# Patient Record
Sex: Male | Born: 1965
Health system: Southern US, Community
[De-identification: ages and names within clinical notes are randomized; demographics above are authoritative.]

## PROBLEM LIST (undated history)

## (undated) DIAGNOSIS — R768 Other specified abnormal immunological findings in serum: Secondary | ICD-10-CM

## (undated) DIAGNOSIS — K219 Gastro-esophageal reflux disease without esophagitis: Secondary | ICD-10-CM

## (undated) DIAGNOSIS — Z860101 Personal history of adenomatous and serrated colon polyps: Secondary | ICD-10-CM

## (undated) HISTORY — PX: POLYPECTOMY: SHX149

## (undated) HISTORY — DX: Gastro-esophageal reflux disease without esophagitis: K21.9

## (undated) HISTORY — DX: Other specified abnormal immunological findings in serum: R76.8

## (undated) HISTORY — PX: COLONOSCOPY: SHX174

## (undated) HISTORY — DX: Personal history of adenomatous and serrated colon polyps: Z86.0101

---

## 2000-02-15 HISTORY — PX: KNEE ARTHROSCOPY W/ LASER: SHX1872

## 2001-10-29 ENCOUNTER — Encounter: Admission: RE | Admit: 2001-10-29 | Discharge: 2001-10-29 | Payer: Self-pay | Admitting: Internal Medicine

## 2004-06-28 ENCOUNTER — Encounter: Admission: RE | Admit: 2004-06-28 | Discharge: 2004-06-28 | Payer: Self-pay | Admitting: Specialist

## 2007-08-21 ENCOUNTER — Ambulatory Visit: Payer: Self-pay | Admitting: Internal Medicine

## 2007-08-21 DIAGNOSIS — M25469 Effusion, unspecified knee: Secondary | ICD-10-CM | POA: Insufficient documentation

## 2007-08-21 DIAGNOSIS — M25569 Pain in unspecified knee: Secondary | ICD-10-CM | POA: Insufficient documentation

## 2007-08-22 ENCOUNTER — Encounter (INDEPENDENT_AMBULATORY_CARE_PROVIDER_SITE_OTHER): Payer: Self-pay | Admitting: *Deleted

## 2007-08-29 ENCOUNTER — Encounter: Payer: Self-pay | Admitting: Internal Medicine

## 2010-06-29 NOTE — Assessment & Plan Note (Signed)
[facility]                                 ON-CALL NOTE   Bryan, Ward                    MRN:          [id number]  DATE:08/20/2007                            DOB:          06/09/1964    DATE OF INTERACTION:  August 20, 2007 at 5:21 p.m.   PHONE NUMBER:  [phone].   OBJECTIVE:  The patient wants to talk with Dr. Posey Rea.  I am not sure  he understands that Dr. Posey Rea is not on-call.  He does not want to  discuss whatever it is with me and would like to talk with or see Dr.  Posey Rea.  I told him to call tomorrow morning at 8:30 and try and get  an appointment with Dr. Posey Rea or speak with him on the phone.   Primary care [name] is Dr. Posey Rea, home office at [facility].     Arta Silence, MD  Electronically Signed    RNS/MedQ  DD: 08/20/2007  DT: 08/21/2007  Job #: [id number]

## 2010-06-29 NOTE — Assessment & Plan Note (Signed)
[facility]                                 ON-CALL NOTE   ISMEAL, HEIDER                    MRN:          [id number]  DATE:08/20/2007                            DOB:          06/09/1964    DATE OF INTERACTION:  August 20, 2007 at 5:21 p.m.   PHONE NUMBER:  [phone].   OBJECTIVE:  The patient wants to talk with Dr. Posey Rea.  I am not sure  he understands that Dr. Posey Rea is not on-call.  He does not want to  discuss whatever it is with me and would like to talk with or see Dr.  Posey Rea.  I told him to call tomorrow morning at 8:30 and try and get  an appointment with Dr. Posey Rea or speak with him on the phone.   Primary care Omaria Plunk is Dr. Posey Rea, home office at [facility].     Arta Silence, MD     RNS/MedQ  DD: 08/20/2007  DT: 08/21/2007  Job #: [id number]

## 2013-01-11 ENCOUNTER — Ambulatory Visit (INDEPENDENT_AMBULATORY_CARE_PROVIDER_SITE_OTHER)
Admission: RE | Admit: 2013-01-11 | Discharge: 2013-01-11 | Disposition: A | Discharge status: Home / Self Care | Payer: BC Managed Care – PPO | Source: Ambulatory Visit | Attending: Internal Medicine | Admitting: Internal Medicine

## 2013-01-11 ENCOUNTER — Encounter: Payer: Self-pay | Admitting: Internal Medicine

## 2013-01-11 ENCOUNTER — Ambulatory Visit (INDEPENDENT_AMBULATORY_CARE_PROVIDER_SITE_OTHER): Payer: BC Managed Care – PPO | Admitting: Internal Medicine

## 2013-01-11 VITALS — BP 104/80 | HR 80 | Temp 98.9°F | Resp 16 | Ht 73.0 in | Wt 224.0 lb

## 2013-01-11 DIAGNOSIS — M79645 Pain in left finger(s): Secondary | ICD-10-CM | POA: Insufficient documentation

## 2013-01-11 DIAGNOSIS — Z0000 Encounter for general adult medical examination without abnormal findings: Secondary | ICD-10-CM

## 2013-01-11 DIAGNOSIS — Z23 Encounter for immunization: Secondary | ICD-10-CM

## 2013-01-11 MED ORDER — MELOXICAM 15 MG PO TABS: 15.0000 mg | ORAL_TABLET | Freq: Every day | ORAL | Status: DC

## 2013-01-11 MED ORDER — VITAMIN D 1000 UNITS PO TABS: 1000.0000 [IU] | ORAL_TABLET | Freq: Every day | ORAL | Status: DC

## 2013-01-11 NOTE — Progress Notes (Signed)
Pre visit review using our clinic review tool, if applicable. No additional management support is needed unless otherwise documented below in the visit note. 

## 2013-01-11 NOTE — Assessment & Plan Note (Signed)
11/14 Trigger thumb Meloxicam X ray

## 2013-01-11 NOTE — Progress Notes (Signed)
   Subjective:    Patient ID: Bryan Ward, male    DOB: 04-13-1965, 47 y.o.   MRN: [id number]  HPI  The patient is here for a wellness exam. The patient has been doing well overall without major physical or psychological issues going on lately.  C/o L thumb pain x weeks, worse in am, clicking  Night shift work  Review of Systems  Constitutional: Negative for appetite change, fatigue and unexpected weight change.  HENT: Negative for congestion, dental problem, ear pain, nosebleeds, rhinorrhea, sneezing, sore throat and trouble swallowing.   Eyes: Negative for itching and visual disturbance.  Respiratory: Positive for cough.   Cardiovascular: Negative for chest pain, palpitations and leg swelling.  Gastrointestinal: Negative for nausea, vomiting, abdominal pain, diarrhea, blood in stool and abdominal distention.  Genitourinary: Negative for frequency and hematuria.  Musculoskeletal: Negative for back pain, gait problem, joint swelling, neck pain and neck stiffness.  Skin: Negative for rash and wound.  Neurological: Negative for dizziness, tremors, speech difficulty and weakness.  Psychiatric/Behavioral: Negative for suicidal ideas, sleep disturbance, dysphoric mood and agitation. The patient is not nervous/anxious.        Objective:   Physical Exam  Constitutional: He is oriented to person, place, and time. He appears well-developed and well-nourished. No distress.  HENT:  Head: Normocephalic and atraumatic.  Right Ear: External ear normal.  Left Ear: External ear normal.  Nose: Nose normal.  Mouth/Throat: Oropharynx is clear and moist. No oropharyngeal exudate.  Eyes: Conjunctivae and EOM are normal. Pupils are equal, round, and reactive to light. Right eye exhibits no discharge. Left eye exhibits no discharge. No scleral icterus.  Neck: Normal range of motion. Neck supple. No JVD present. No tracheal deviation present. No thyromegaly present.  Cardiovascular: Normal rate,  regular rhythm, normal heart sounds and intact distal pulses.  Exam reveals no gallop and no friction rub.   No murmur heard. Pulmonary/Chest: Effort normal and breath sounds normal. No stridor. No respiratory distress. He has no wheezes. He has no rales. He exhibits no tenderness.  Abdominal: Soft. Bowel sounds are normal. He exhibits no distension and no mass. There is no tenderness. There is no rebound and no guarding.  Genitourinary: Guaiac negative stool. No penile tenderness.  Musculoskeletal: Normal range of motion. He exhibits no edema and no tenderness.  B thumbs are WNL at present  Lymphadenopathy:    He has no cervical adenopathy.  Neurological: He is alert and oriented to person, place, and time. He has normal reflexes. No cranial nerve deficit. He exhibits normal muscle tone. Coordination normal.  Skin: Skin is warm and dry. No rash noted. He is not diaphoretic. No erythema. No pallor.  Psychiatric: He has a normal mood and affect. His behavior is normal. Judgment and thought content normal.    EKG nl      Assessment & Plan:

## 2013-01-11 NOTE — Assessment & Plan Note (Signed)
We discussed age appropriate health related issues, including available/recomended screening tests and vaccinations. We discussed a need for adhering to healthy diet and exercise. Labs/EKG were reviewed/ordered. All questions were answered. Labs EKG CXR due to cough

## 2013-01-16 ENCOUNTER — Other Ambulatory Visit (INDEPENDENT_AMBULATORY_CARE_PROVIDER_SITE_OTHER): Payer: BC Managed Care – PPO

## 2013-01-16 DIAGNOSIS — Z0000 Encounter for general adult medical examination without abnormal findings: Secondary | ICD-10-CM

## 2013-01-16 LAB — CBC WITH DIFFERENTIAL/PLATELET
Eosinophils Absolute: 0.3 10*3/uL (ref 0.0–0.7)
Eosinophils Relative: 3.7 % (ref 0.0–5.0)
HCT: 44.3 % (ref 39.0–52.0)
Hemoglobin: 15 g/dL (ref 13.0–17.0)
Lymphs Abs: 2.7 10*3/uL (ref 0.7–4.0)
Monocytes Absolute: 0.7 10*3/uL (ref 0.1–1.0)
Neutro Abs: 4.7 10*3/uL (ref 1.4–7.7)
Neutrophils Relative %: 55.9 % (ref 43.0–77.0)
Platelets: 239 10*3/uL (ref 150.0–400.0)
RBC: 5.03 Mil/uL (ref 4.22–5.81)
RDW: 13.5 % (ref 11.5–14.6)
WBC: 8.5 10*3/uL (ref 4.5–10.5)

## 2013-01-16 LAB — BASIC METABOLIC PANEL
Chloride: 105 mEq/L (ref 96–112)
Creatinine, Ser: 1 mg/dL (ref 0.4–1.5)

## 2013-01-16 LAB — HEPATIC FUNCTION PANEL
Alkaline Phosphatase: 59 U/L (ref 39–117)
Total Bilirubin: 0.8 mg/dL (ref 0.3–1.2)

## 2013-01-16 LAB — LIPID PANEL
HDL: 39.9 mg/dL (ref >39.00)
LDL Cholesterol: 129 mg/dL — ABNORMAL HIGH (ref 0–99)
Total CHOL/HDL Ratio: 5

## 2013-01-16 LAB — URINALYSIS
Hgb urine dipstick: NEGATIVE
Ketones, ur: NEGATIVE
Nitrite: NEGATIVE
Total Protein, Urine: NEGATIVE
Urine Glucose: NEGATIVE
pH: 6 (ref 5.0–8.0)

## 2013-01-16 LAB — PSA: PSA: 0.67 ng/mL (ref 0.10–4.00)

## 2013-01-24 MED ORDER — MELOXICAM 15 MG PO TABS: 15.0000 mg | ORAL_TABLET | Freq: Every day | ORAL | Status: DC

## 2013-01-24 MED ORDER — VITAMIN D 1000 UNITS PO TABS: 1000.0000 [IU] | ORAL_TABLET | Freq: Every day | ORAL | Status: AC

## 2014-01-14 ENCOUNTER — Encounter: Payer: Self-pay | Admitting: Internal Medicine

## 2014-01-14 ENCOUNTER — Ambulatory Visit (INDEPENDENT_AMBULATORY_CARE_PROVIDER_SITE_OTHER)
Admission: RE | Admit: 2014-01-14 | Discharge: 2014-01-14 | Disposition: A | Discharge status: Home / Self Care | Payer: BC Managed Care – PPO | Source: Ambulatory Visit | Attending: Internal Medicine | Admitting: Internal Medicine

## 2014-01-14 ENCOUNTER — Ambulatory Visit (INDEPENDENT_AMBULATORY_CARE_PROVIDER_SITE_OTHER): Payer: BC Managed Care – PPO | Admitting: Internal Medicine

## 2014-01-14 ENCOUNTER — Other Ambulatory Visit (INDEPENDENT_AMBULATORY_CARE_PROVIDER_SITE_OTHER): Payer: BC Managed Care – PPO

## 2014-01-14 VITALS — BP 140/90 | HR 84 | Temp 97.6°F | Ht 72.0 in | Wt 221.0 lb

## 2014-01-14 DIAGNOSIS — Z23 Encounter for immunization: Secondary | ICD-10-CM

## 2014-01-14 DIAGNOSIS — R05 Cough: Secondary | ICD-10-CM

## 2014-01-14 DIAGNOSIS — B9681 Helicobacter pylori [H. pylori] as the cause of diseases classified elsewhere: Secondary | ICD-10-CM

## 2014-01-14 DIAGNOSIS — K219 Gastro-esophageal reflux disease without esophagitis: Secondary | ICD-10-CM | POA: Insufficient documentation

## 2014-01-14 DIAGNOSIS — Z0000 Encounter for general adult medical examination without abnormal findings: Secondary | ICD-10-CM

## 2014-01-14 DIAGNOSIS — J189 Pneumonia, unspecified organism: Secondary | ICD-10-CM

## 2014-01-14 DIAGNOSIS — R059 Cough, unspecified: Secondary | ICD-10-CM

## 2014-01-14 DIAGNOSIS — A048 Other specified bacterial intestinal infections: Secondary | ICD-10-CM

## 2014-01-14 LAB — CBC WITH DIFFERENTIAL/PLATELET
BASOS ABS: 0 10*3/uL (ref 0.0–0.1)
BASOS PCT: 0.4 % (ref 0.0–3.0)
EOS ABS: 0.2 10*3/uL (ref 0.0–0.7)
Eosinophils Relative: 2.3 % (ref 0.0–5.0)
HCT: 46.3 % (ref 39.0–52.0)
HEMOGLOBIN: 15.4 g/dL (ref 13.0–17.0)
LYMPHS ABS: 3.1 10*3/uL (ref 0.7–4.0)
Lymphocytes Relative: 28.2 % (ref 12.0–46.0)
MCHC: 33.1 g/dL (ref 30.0–36.0)
MCV: 87.7 fl (ref 78.0–100.0)
MONO ABS: 1.3 10*3/uL — AB (ref 0.1–1.0)
Monocytes Relative: 11.9 % (ref 3.0–12.0)
NEUTROS PCT: 57.2 % (ref 43.0–77.0)
Neutro Abs: 6.3 10*3/uL (ref 1.4–7.7)
Platelets: 259 10*3/uL (ref 150.0–400.0)
RBC: 5.28 Mil/uL (ref 4.22–5.81)
RDW: 13.6 % (ref 11.5–15.5)
WBC: 11 10*3/uL — ABNORMAL HIGH (ref 4.0–10.5)

## 2014-01-14 LAB — HEPATIC FUNCTION PANEL
ALBUMIN: 4.4 g/dL (ref 3.5–5.2)
ALK PHOS: 70 U/L (ref 39–117)
ALT: 32 U/L (ref 0–53)
AST: 23 U/L (ref 0–37)
BILIRUBIN DIRECT: 0.1 mg/dL (ref 0.0–0.3)
BILIRUBIN TOTAL: 0.7 mg/dL (ref 0.2–1.2)
Total Protein: 7.1 g/dL (ref 6.0–8.3)

## 2014-01-14 LAB — TSH: TSH: 1.23 u[IU]/mL (ref 0.35–4.50)

## 2014-01-14 LAB — LIPID PANEL
CHOL/HDL RATIO: 6
Cholesterol: 209 mg/dL — ABNORMAL HIGH (ref 0–200)
HDL: 36.4 mg/dL — ABNORMAL LOW (ref >39.00)
NONHDL: 172.6
TRIGLYCERIDES: 211 mg/dL — AB (ref 0.0–149.0)
VLDL: 42.2 mg/dL — ABNORMAL HIGH (ref 0.0–40.0)

## 2014-01-14 LAB — URINALYSIS
Bilirubin Urine: NEGATIVE
HGB URINE DIPSTICK: NEGATIVE
Ketones, ur: NEGATIVE
Leukocytes, UA: NEGATIVE
Nitrite: NEGATIVE
PH: 5.5 (ref 5.0–8.0)
Specific Gravity, Urine: >=1.03 — AB (ref 1.000–1.030)
Total Protein, Urine: NEGATIVE
Urine Glucose: NEGATIVE
Urobilinogen, UA: 0.2 (ref 0.0–1.0)

## 2014-01-14 LAB — BASIC METABOLIC PANEL
BUN: 16 mg/dL (ref 6–23)
CHLORIDE: 108 meq/L (ref 96–112)
CO2: 24 meq/L (ref 19–32)
Calcium: 10.1 mg/dL (ref 8.4–10.5)
Creatinine, Ser: 1.1 mg/dL (ref 0.4–1.5)
GFR: 72.83 mL/min (ref >60.00)
Glucose, Bld: 95 mg/dL (ref 70–99)
POTASSIUM: 4.3 meq/L (ref 3.5–5.1)
SODIUM: 140 meq/L (ref 135–145)

## 2014-01-14 LAB — LDL CHOLESTEROL, DIRECT: Direct LDL: 135.4 mg/dL

## 2014-01-14 LAB — PSA: PSA: 0.66 ng/mL (ref 0.10–4.00)

## 2014-01-14 LAB — H. PYLORI ANTIBODY, IGG: H Pylori IgG: POSITIVE — AB

## 2014-01-14 MED ORDER — AMOXICILLIN 500 MG PO CAPS: 1000.0000 mg | ORAL_CAPSULE | Freq: Two times a day (BID) | ORAL | Status: DC

## 2014-01-14 MED ORDER — RANITIDINE HCL 150 MG PO TABS: 150.0000 mg | ORAL_TABLET | Freq: Two times a day (BID) | ORAL | Status: DC

## 2014-01-14 MED ORDER — OMEPRAZOLE 40 MG PO CPDR: 40.0000 mg | DELAYED_RELEASE_CAPSULE | Freq: Two times a day (BID) | ORAL | Status: DC

## 2014-01-14 MED ORDER — CLARITHROMYCIN 500 MG PO TABS: 500.0000 mg | ORAL_TABLET | Freq: Two times a day (BID) | ORAL | Status: DC

## 2014-01-14 NOTE — Assessment & Plan Note (Signed)
We discussed age appropriate health related issues, including available/recomended screening tests and vaccinations. We discussed a need for adhering to healthy diet and exercise. Labs/EKG were reviewed/ordered. All questions were answered.  Colonoscopy suggested

## 2014-01-14 NOTE — Assessment & Plan Note (Signed)
11/15 "globus" sensation Father died of esoph cancer GI ref Labs

## 2014-01-14 NOTE — Assessment & Plan Note (Signed)
CXR

## 2014-01-14 NOTE — Assessment & Plan Note (Signed)
See abx CXR in 2 mo

## 2014-01-14 NOTE — Progress Notes (Signed)
Subjective:    HPI  The patient is here for a wellness exam. C/o "globus" sensation. C/o cough. His dad died of esophageal cancer this year... C/o ST and runny nose. 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  HENT: Negative for congestion, dental problem, ear pain, nosebleeds, rhinorrhea, sneezing, sore throat and trouble swallowing.   Eyes: Negative for itching and visual disturbance.  Respiratory: Positive for cough.   Cardiovascular: Negative for chest pain, palpitations and leg swelling.  Gastrointestinal: Negative for nausea, vomiting, abdominal pain, diarrhea, blood in stool and abdominal distention.  Genitourinary: Negative for frequency and hematuria.  Musculoskeletal: Negative for back pain, joint swelling, gait problem, neck pain and neck stiffness.  Skin: Negative for rash and wound.  Neurological: Negative for dizziness, tremors, speech difficulty and weakness.  Psychiatric/Behavioral: Negative for suicidal ideas, sleep disturbance, dysphoric mood and agitation. The patient is not nervous/anxious.        Objective:   Physical Exam  Constitutional: He is oriented to person, place, and time. He appears well-developed and well-nourished. No distress.  HENT:  Head: Normocephalic and atraumatic.  Right Ear: External ear normal.  Left Ear: External ear normal.  Nose: Nose normal.  Mouth/Throat: Oropharynx is clear and moist. No oropharyngeal exudate.  Eyes: Conjunctivae and EOM are normal. Pupils are equal, round, and reactive to light. Right eye exhibits no discharge. Left eye exhibits no discharge. No scleral icterus.    Neck: Normal range of motion. Neck supple. No JVD present. No tracheal deviation present. No thyromegaly present.  Cardiovascular: Normal rate, regular rhythm, normal heart sounds and intact distal pulses.  Exam reveals no gallop and no friction rub.   No murmur heard. Pulmonary/Chest: Effort normal and breath sounds normal. No stridor. No respiratory distress. He has no wheezes. He has no rales. He exhibits no tenderness.  Abdominal: Soft. Bowel sounds are normal. He exhibits no distension and no mass. There is no tenderness. There is no rebound and no guarding.  Genitourinary: Guaiac negative stool. No penile tenderness.  Musculoskeletal: Normal range of motion. He exhibits no edema or tenderness.  B thumbs are WNL at present  Lymphadenopathy:    He has no cervical adenopathy.  Neurological: He is alert and oriented to person, place, and time. He has normal reflexes. No cranial nerve deficit. He exhibits normal muscle tone. Coordination normal.  Skin: Skin is warm and dry. No rash noted. He is not diaphoretic. No erythema. No pallor.  Psychiatric: He has a normal mood and affect. His behavior is normal. Judgment and thought content normal.    Lab Results  Component Value Date   WBC 8.5 01/16/2013   HGB 15.0 01/16/2013   HCT 44.3 01/16/2013   PLT 239.0 01/16/2013   GLUCOSE 97 01/16/2013   CHOL 195 01/16/2013   TRIG 132.0 01/16/2013   HDL 39.90 01/16/2013   LDLCALC 129* 01/16/2013   ALT 35 01/16/2013   AST 29 01/16/2013   NA 141 01/16/2013   K 3.9 01/16/2013   CL 105 01/16/2013   CREATININE 1.0 01/16/2013   BUN 12 01/16/2013  CO2 25 01/16/2013   TSH 0.90 01/16/2013   PSA 0.67 01/16/2013         Assessment & Plan:

## 2014-01-14 NOTE — Progress Notes (Signed)
Pre visit review using our clinic review tool, if applicable. No additional management support is needed unless otherwise documented below in the visit note. 

## 2014-01-14 NOTE — Assessment & Plan Note (Signed)
See Abx and Prilosec

## 2014-01-21 ENCOUNTER — Encounter: Payer: Self-pay | Admitting: Internal Medicine

## 2014-03-06 ENCOUNTER — Encounter: Payer: Self-pay | Admitting: *Deleted

## 2014-03-13 ENCOUNTER — Encounter: Payer: Self-pay | Admitting: Internal Medicine

## 2014-03-13 ENCOUNTER — Ambulatory Visit (INDEPENDENT_AMBULATORY_CARE_PROVIDER_SITE_OTHER): Payer: 59 | Admitting: Internal Medicine

## 2014-03-13 VITALS — BP 118/78 | HR 92 | Ht 72.0 in | Wt 236.1 lb

## 2014-03-13 DIAGNOSIS — R760 Raised antibody titer: Secondary | ICD-10-CM

## 2014-03-13 DIAGNOSIS — Z800 Family history of malignant neoplasm of digestive organs: Secondary | ICD-10-CM

## 2014-03-13 DIAGNOSIS — R0989 Other specified symptoms and signs involving the circulatory and respiratory systems: Secondary | ICD-10-CM

## 2014-03-13 DIAGNOSIS — R768 Other specified abnormal immunological findings in serum: Secondary | ICD-10-CM

## 2014-03-13 DIAGNOSIS — F458 Other somatoform disorders: Secondary | ICD-10-CM

## 2014-03-13 DIAGNOSIS — Z1211 Encounter for screening for malignant neoplasm of colon: Secondary | ICD-10-CM

## 2014-03-13 MED ORDER — MOVIPREP 100 G PO SOLR: 1.0000 | Freq: Once | ORAL | Status: DC

## 2014-03-13 MED ORDER — OMEPRAZOLE 40 MG PO CPDR: 40.0000 mg | DELAYED_RELEASE_CAPSULE | Freq: Every day | ORAL | Status: DC

## 2014-03-13 NOTE — Patient Instructions (Addendum)
You have been scheduled for an endoscopy and colonoscopy. Please follow the written instructions given to you at your visit today. Please pick up your prep at the pharmacy within the next 1-3 days. If you use inhalers (even only as needed), please bring them with you on the day of your procedure. Your physician has requested that you go to www.startemmi.com and enter the access code given to you at your visit today. This web site gives a general overview about your procedure. However, you should still follow specific instructions given to you by our office regarding your preparation for the procedure.  We have sent the following medications to your pharmacy for you to pick up at your convenience: Omeprazole 40mg  daily (decrease from twice daily dosing)  ZO:[id number] Plotnikov

## 2014-03-13 NOTE — Progress Notes (Signed)
Patient ID: Bryan Ward, male   DOB: 04-22-1965, 49 y.o.   MRN: [id number] HPI: Bryan Ward is a 49 year old male with little past medical history other than recently diagnosed H. pylori status post treatment who seen in consultation at the request of Dr. Alain Marion to evaluate globus sensation and also discussed colorectal cancer screening. He is here alone today. He reports overall he is feeling well. He developed globus sensation approximate month ago which he feels like started after drinking a very very cold beverage. Since that time he is noted some mild irritation with presence of something located in the back of his throat. He reports food and fluid goes down easily and without pain. He denies true heartburn but is taking omeprazole 40 mg twice daily since completing antibiotics recently for H. pylori. He denies nausea or vomiting. Denies early satiety. Reports good appetite without weight loss. Reports normal bowel habits. His father had esophageal cancer but also had radiation exposure at [employer] in [place]. He denies  rectal bleeding and melena. Past Medical History  Diagnosis Date  . Helicobacter pylori antibody positive     Past Surgical History  Procedure Laterality Date  . Knee arthroscopy w/ laser Left 2002    Dr Noemi Chapel    Outpatient Prescriptions Prior to Visit  Medication Sig Dispense Refill  . amoxicillin (AMOXIL) 500 MG capsule Take 2 capsules (1,000 mg total) by mouth 2 (two) times daily. 56 capsule 0  . clarithromycin (BIAXIN) 500 MG tablet Take 1 tablet (500 mg total) by mouth 2 (two) times daily. 28 tablet 0  . omeprazole (PRILOSEC) 40 MG capsule Take 1 capsule (40 mg total) by mouth 2 (two) times daily. 90 capsule 0   No facility-administered medications prior to visit.    No Known Allergies  Family History  Problem Relation Age of Onset  . Arthritis Father   . Esophageal cancer Father 10  . Colon cancer Neg Hx   . Colon polyps Neg Hx   . Diabetes  Neg Hx   . Kidney disease Neg Hx   . Heart disease Neg Hx     History  Substance Use Topics  . Smoking status: Former Smoker -- 0.25 packs/day    Types: Cigarettes    Quit date: 10/15/2013  . Smokeless tobacco: Not on file  . Alcohol Use: 0.0 oz/week    0 Not specified per week     Comment: Occassionally    ROS: As per history of present illness, otherwise negative  BP 118/78 mmHg  Pulse 92  Ht 6' (1.829 m)  Wt 236 lb 2 oz (107.106 kg)  BMI 32.02 kg/m2 Constitutional: Well-developed and well-nourished. No distress. HEENT: Normocephalic and atraumatic. Oropharynx is clear and moist. No oropharyngeal exudate. Conjunctivae are normal.  No scleral icterus. Neck: Neck supple. Trachea midline. Cardiovascular: Normal rate, regular rhythm and intact distal pulses. No M/R/G Pulmonary/chest: Effort normal and breath sounds normal. No wheezing, rales or rhonchi. Abdominal: Soft, nontender, nondistended. Bowel sounds active throughout. There are no masses palpable. No hepatosplenomegaly. Extremities: no clubbing, cyanosis, or edema Lymphadenopathy: No cervical adenopathy noted. Neurological: Alert and oriented to person place and time. Skin: Skin is warm and dry. No rashes noted. Psychiatric: Normal mood and affect. Behavior is normal.  RELEVANT LABS AND IMAGING: CBC    Component Value Date/Time   WBC 11.0* 01/14/2014 1113   RBC 5.28 01/14/2014 1113   HGB 15.4 01/14/2014 1113   HCT 46.3 01/14/2014 1113   PLT 259.0 01/14/2014 1113  MCV 87.7 01/14/2014 1113   MCHC 33.1 01/14/2014 1113   RDW 13.6 01/14/2014 1113   LYMPHSABS 3.1 01/14/2014 1113   MONOABS 1.3* 01/14/2014 1113   EOSABS 0.2 01/14/2014 1113   BASOSABS 0.0 01/14/2014 1113    CMP     Component Value Date/Time   NA 140 01/14/2014 1113   K 4.3 01/14/2014 1113   CL 108 01/14/2014 1113   CO2 24 01/14/2014 1113   GLUCOSE 95 01/14/2014 1113   BUN 16 01/14/2014 1113   CREATININE 1.1 01/14/2014 1113   CALCIUM 10.1  01/14/2014 1113   PROT 7.1 01/14/2014 1113   ALBUMIN 4.4 01/14/2014 1113   AST 23 01/14/2014 1113   ALT 32 01/14/2014 1113   ALKPHOS 70 01/14/2014 1113   BILITOT 0.7 01/14/2014 1113    ASSESSMENT/PLAN:  49 year old male with little past medical history other than recently diagnosed H. pylori status post treatment who seen in consultation at the request of Dr. Alain Marion to evaluate globus sensation and also discussed colorectal cancer screening.  1. Globus sensation/family history of esophageal cancer -- this could be secondary to reflux though I would have expected improvement on PPI. Will investigate further with upper endoscopy also given the fact family history of esophageal cancer. Will decrease his omeprazole to 40 mg once daily until endoscopy. Further recommendations thereafter. We discussed the test today including the risks and benefits and he is agreeable to proceed.  2. H. pylori antibody positive -- status post treatment. Plan gastric biopsies to confirm eradication of the time of endoscopy  3. CRC screening -- average risk colorectal cancer screening recommended colonoscopy. The test was discussed including the risks and benefits and he is agreeable to proceed

## 2014-04-14 ENCOUNTER — Ambulatory Visit (AMBULATORY_SURGERY_CENTER): Payer: 59 | Admitting: Internal Medicine

## 2014-04-14 ENCOUNTER — Encounter: Payer: Self-pay | Admitting: Internal Medicine

## 2014-04-14 VITALS — BP 151/85 | HR 76 | Temp 98.7°F | Resp 33 | Ht 72.0 in | Wt 236.0 lb

## 2014-04-14 DIAGNOSIS — Z800 Family history of malignant neoplasm of digestive organs: Secondary | ICD-10-CM

## 2014-04-14 DIAGNOSIS — D124 Benign neoplasm of descending colon: Secondary | ICD-10-CM

## 2014-04-14 DIAGNOSIS — D123 Benign neoplasm of transverse colon: Secondary | ICD-10-CM

## 2014-04-14 DIAGNOSIS — B9681 Helicobacter pylori [H. pylori] as the cause of diseases classified elsewhere: Secondary | ICD-10-CM

## 2014-04-14 DIAGNOSIS — K2950 Unspecified chronic gastritis without bleeding: Secondary | ICD-10-CM

## 2014-04-14 DIAGNOSIS — R0989 Other specified symptoms and signs involving the circulatory and respiratory systems: Secondary | ICD-10-CM

## 2014-04-14 DIAGNOSIS — K635 Polyp of colon: Secondary | ICD-10-CM

## 2014-04-14 DIAGNOSIS — F458 Other somatoform disorders: Secondary | ICD-10-CM

## 2014-04-14 DIAGNOSIS — A048 Other specified bacterial intestinal infections: Secondary | ICD-10-CM

## 2014-04-14 DIAGNOSIS — K219 Gastro-esophageal reflux disease without esophagitis: Secondary | ICD-10-CM

## 2014-04-14 DIAGNOSIS — Z1211 Encounter for screening for malignant neoplasm of colon: Secondary | ICD-10-CM

## 2014-04-14 MED ORDER — SODIUM CHLORIDE 0.9 % IV SOLN: 500.0000 mL | INTRAVENOUS | Status: DC

## 2014-04-14 NOTE — Progress Notes (Signed)
Called to room to assist during endoscopic procedure.  Patient ID and intended procedure confirmed with present staff. Received instructions for my participation in the procedure from the performing physician.  

## 2014-04-14 NOTE — Patient Instructions (Signed)
YOU HAD AN ENDOSCOPIC PROCEDURE TODAY AT [facility]:   Refer to the procedure report that was given to you for any specific questions about what was found during the examination.  If the procedure report does not answer your questions, please call your gastroenterologist to clarify.  If you requested that your care partner not be given the details of your procedure findings, then the procedure report has been included in a sealed envelope for you to review at your convenience later.  YOU SHOULD EXPECT: Some feelings of bloating in the abdomen. Passage of more gas than usual.  Walking can help get rid of the air that was put into your GI tract during the procedure and reduce the bloating. If you had a lower endoscopy (such as a colonoscopy or flexible sigmoidoscopy) you may notice spotting of blood in your stool or on the toilet paper. If you underwent a bowel prep for your procedure, you may not have a normal bowel movement for a few days.  Please Note:  You might notice some irritation and congestion in your nose or some drainage.  This is from the oxygen used during your procedure.  There is no need for concern and it should clear up in a day or so.  SYMPTOMS TO REPORT IMMEDIATELY:   Following lower endoscopy (colonoscopy or flexible sigmoidoscopy):  Excessive amounts of blood in the stool  Significant tenderness or worsening of abdominal pains  Swelling of the abdomen that is new, acute  Fever of 100F or higher    A gastroenterologist can be reached at any hour by calling [phone].   DIET: Your first meal following the procedure should be a small meal and then it is ok to progress to your normal diet. Heavy or fried foods are harder to digest and may make you feel nauseous or bloated.  Likewise, meals heavy in dairy and vegetables can increase bloating.  Drink plenty of fluids but you should avoid alcoholic beverages for 24 hours.  ACTIVITY:  You should plan to take  it easy for the rest of today and you should NOT DRIVE or use heavy machinery until tomorrow (because of the sedation medicines used during the test).    FOLLOW UP: Our staff will call the number listed on your records the next business day following your procedure to check on you and address any questions or concerns that you may have regarding the information given to you following your procedure. If we do not reach you, we will leave a message.  However, if you are feeling well and you are not experiencing any problems, there is no need to return our call.  We will assume that you have returned to your regular daily activities without incident.  Hiatal hernia and polyp information given. No NSAIDS for 2 weeks.  If any biopsies were taken you will be contacted by phone or by letter within the next 1-3 weeks.  Please call us at [phone] if you have not heard about the biopsies in 3 weeks.    SIGNATURES/CONFIDENTIALITY: You and/or your care partner have signed paperwork which will be entered into your electronic medical record.  These signatures attest to the fact that that the information above on your After Visit Summary has been reviewed and is understood.  Full responsibility of the confidentiality of this discharge information lies with you and/or your care-partner.

## 2014-04-14 NOTE — Progress Notes (Signed)
Report to PACU, RN, vss, BBS= Clear.  

## 2014-04-14 NOTE — Op Note (Signed)
[place]  [address]. [place], [address]   ENDOSCOPY PROCEDURE REPORT  PATIENT: Bryan Ward, Bryan Ward  MR#: [id number] BIRTHDATE: [birth year] , 48  yrs. old GENDER: male ENDOSCOPIST: Jerene Bears, MD REFERRED BY:  Creig Hines, M.D. PROCEDURE DATE:  04/14/2014 PROCEDURE:  EGD w/ biopsy for H.pylori ASA CLASS:     Class II INDICATIONS:  globus sensation, family history of esophageal cancer, hx of H pylori treated after diagnosis by serum Ab. MEDICATIONS: Monitored anesthesia care and Propofol 250 mg IV TOPICAL ANESTHETIC: none  DESCRIPTION OF PROCEDURE: After the risks benefits and alternatives of the procedure were thoroughly explained, informed consent was obtained.  The LB [id number] endoscope was introduced through the mouth and advanced to the second portion of the duodenum , Without limitations.  The instrument was slowly withdrawn as the mucosa was fully examined.   ESOPHAGUS: The mucosa of the esophagus appeared normal.   No evidence of Barrett's esophagus.  STOMACH: A 2 cm hiatal hernia was noted.   The mucosa of the stomach appeared normal.  Cold forcep biopsies were taken at the gastric body, antrum and angularis to evaluate for h.  pylori.  DUODENUM: The duodenal mucosa showed no abnormalities in the bulb and 2nd part of the duodenum.  Retroflexed views revealed a hiatal hernia.     The scope was then withdrawn from the patient and the procedure completed.  COMPLICATIONS: There were no immediate complications.  ENDOSCOPIC IMPRESSION: 1.   The mucosa of the esophagus appeared normal, no finding to explain globus sensation 2.   2 cm hiatal hernia 3.   The mucosa of the stomach appeared normal 4.   The duodenal mucosa showed no abnormalities in the bulb and 2nd part of the duodenum  RECOMMENDATIONS: Await biopsy results   eSigned:  Jerene Bears, MD 04/14/2014 2:15 PM    IH:[id number] V.  Plotnikov, MD and The Patient

## 2014-04-14 NOTE — Op Note (Signed)
[place]  [address] [place], [address]   COLONOSCOPY PROCEDURE REPORT  PATIENT: Bryan, Ward  MR#: [id number] BIRTHDATE: 1965-03-20 , 48  yrs. old GENDER: male ENDOSCOPIST: Jerene Bears, MD REFERRED XN:[id number] Avel Sensor, M.D. PROCEDURE DATE:  04/14/2014 PROCEDURE:   Colonoscopy with snare polypectomy and Colonoscopy with cold biopsy polypectomy First Screening Colonoscopy - Avg.  risk and is 50 yrs.  old or older Yes.  Prior Negative Screening - Now for repeat screening. N/A  History of Adenoma - Now for follow-up colonoscopy & has been > or = to 3 yrs.  N/A  Polyps Removed Today? Yes. ASA CLASS:   Class II INDICATIONS:average risk patient for colon cancer and 1st colonoscopy. MEDICATIONS: Monitored anesthesia care, Propofol 50 mg IV, Residual sedation present, and Lidocaine 200 mg IV  DESCRIPTION OF PROCEDURE:   After the risks benefits and alternatives of the procedure were thoroughly explained, informed consent was obtained.  The digital rectal exam revealed no rectal mass.   The LB [id number]  endoscope was introduced through the anus and advanced to the cecum, which was identified by both the appendix and ileocecal valve. No adverse events experienced. The quality of the prep was good, using MoviPrep  The instrument was then slowly withdrawn as the colon was fully examined.  COLON FINDINGS: A sessile polyp measuring 10 mm in size was found in the descending colon.  A polypectomy was performed using snare cautery.  The resection was complete, the polyp tissue was completely retrieved and sent to histology.   A sessile polyp measuring 3 mm in size was found in the transverse colon.  A polypectomy was performed with cold forceps.  The resection was complete, the polyp tissue was completely retrieved and sent to histology.  Retroflexed views revealed external hemorrhoids. The time to cecum=3 minutes 01 seconds.  Withdrawal time=13 minutes  38 seconds.  The scope was withdrawn and the procedure completed. COMPLICATIONS: There were no immediate complications.  ENDOSCOPIC IMPRESSION: 1.   Sessile polyp was found in the descending colon; polypectomy was performed using snare cautery 2.   Sessile polyp was found in the transverse colon; polypectomy was performed with cold forceps  RECOMMENDATIONS: 1.  Hold Aspirin and all other NSAIDS for 2 weeks. 2.  Await pathology results 3.  Timing of repeat colonoscopy will be determined by pathology findings. 4.  You will receive a letter within 1-2 weeks with the results of your biopsy as well as final recommendations.  Please call my office if you have not received a letter after 3 weeks.  eSigned:  Jerene Bears, MD 04/14/2014 2:17 PM   cc: [place].  Plotnikov, MD and The Patient

## 2014-04-15 ENCOUNTER — Telehealth: Payer: Self-pay | Admitting: *Deleted

## 2014-04-15 NOTE — Telephone Encounter (Signed)
  Follow up Call-  Call back number 04/14/2014  Post procedure Call Back phone  # [phone]  Permission to leave phone message Yes     Patient questions:  Do you have a fever, pain , or abdominal swelling? No. Pain Score  0 *  Have you tolerated food without any problems? Yes.    Have you been able to return to your normal activities? Yes.    Do you have any questions about your discharge instructions: Diet   No. Medications  No. Follow up visit  No.  Do you have questions or concerns about your Care? No.  Actions: * If pain score is 4 or above: No action needed, pain <4.

## 2014-04-22 ENCOUNTER — Encounter: Payer: Self-pay | Admitting: Internal Medicine

## 2014-04-25 ENCOUNTER — Encounter: Payer: Self-pay | Admitting: Internal Medicine

## 2015-05-14 IMAGING — CR DG CHEST 2V
2 series · 2 of 2 positions shown · non-contrast
Comparison: Chest x-ray of June 28, 2004

CLINICAL DATA: History of tobacco use.

EXAM:
CHEST  2 VIEW

[view not recorded (1 of 2)]
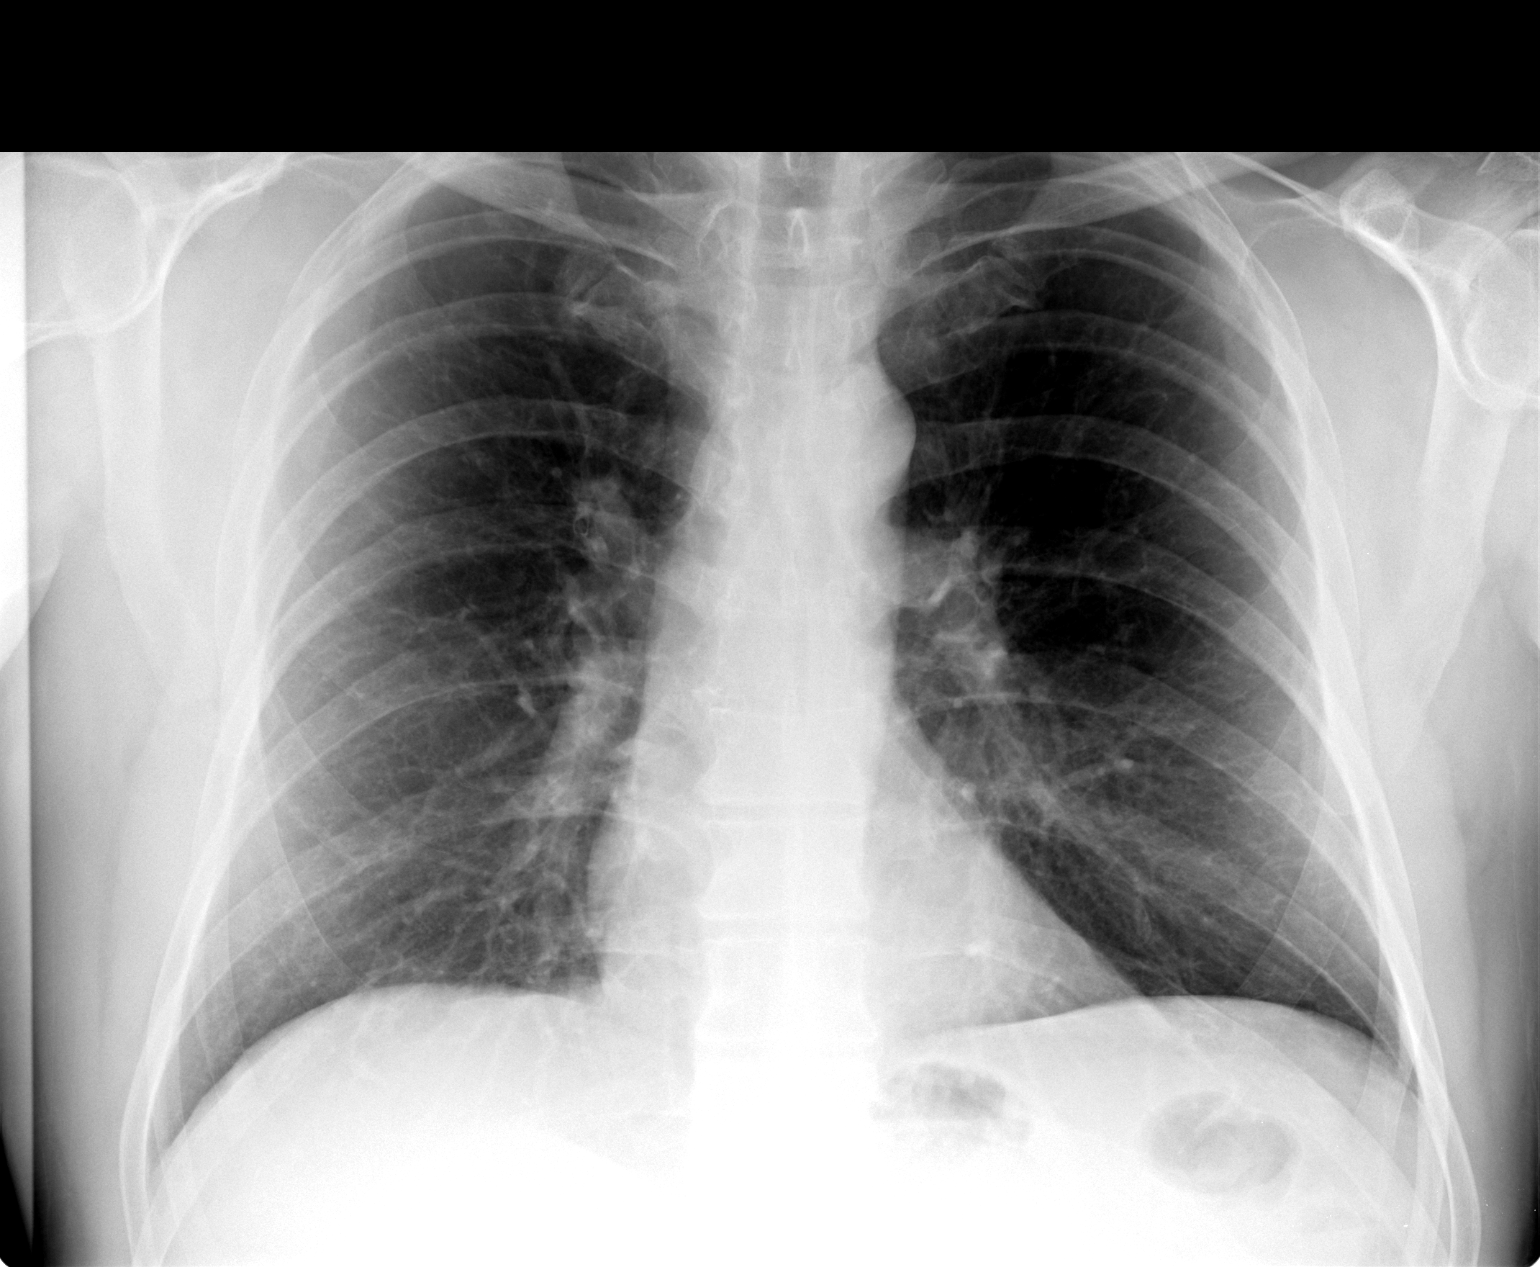

[view not recorded (2 of 2)]
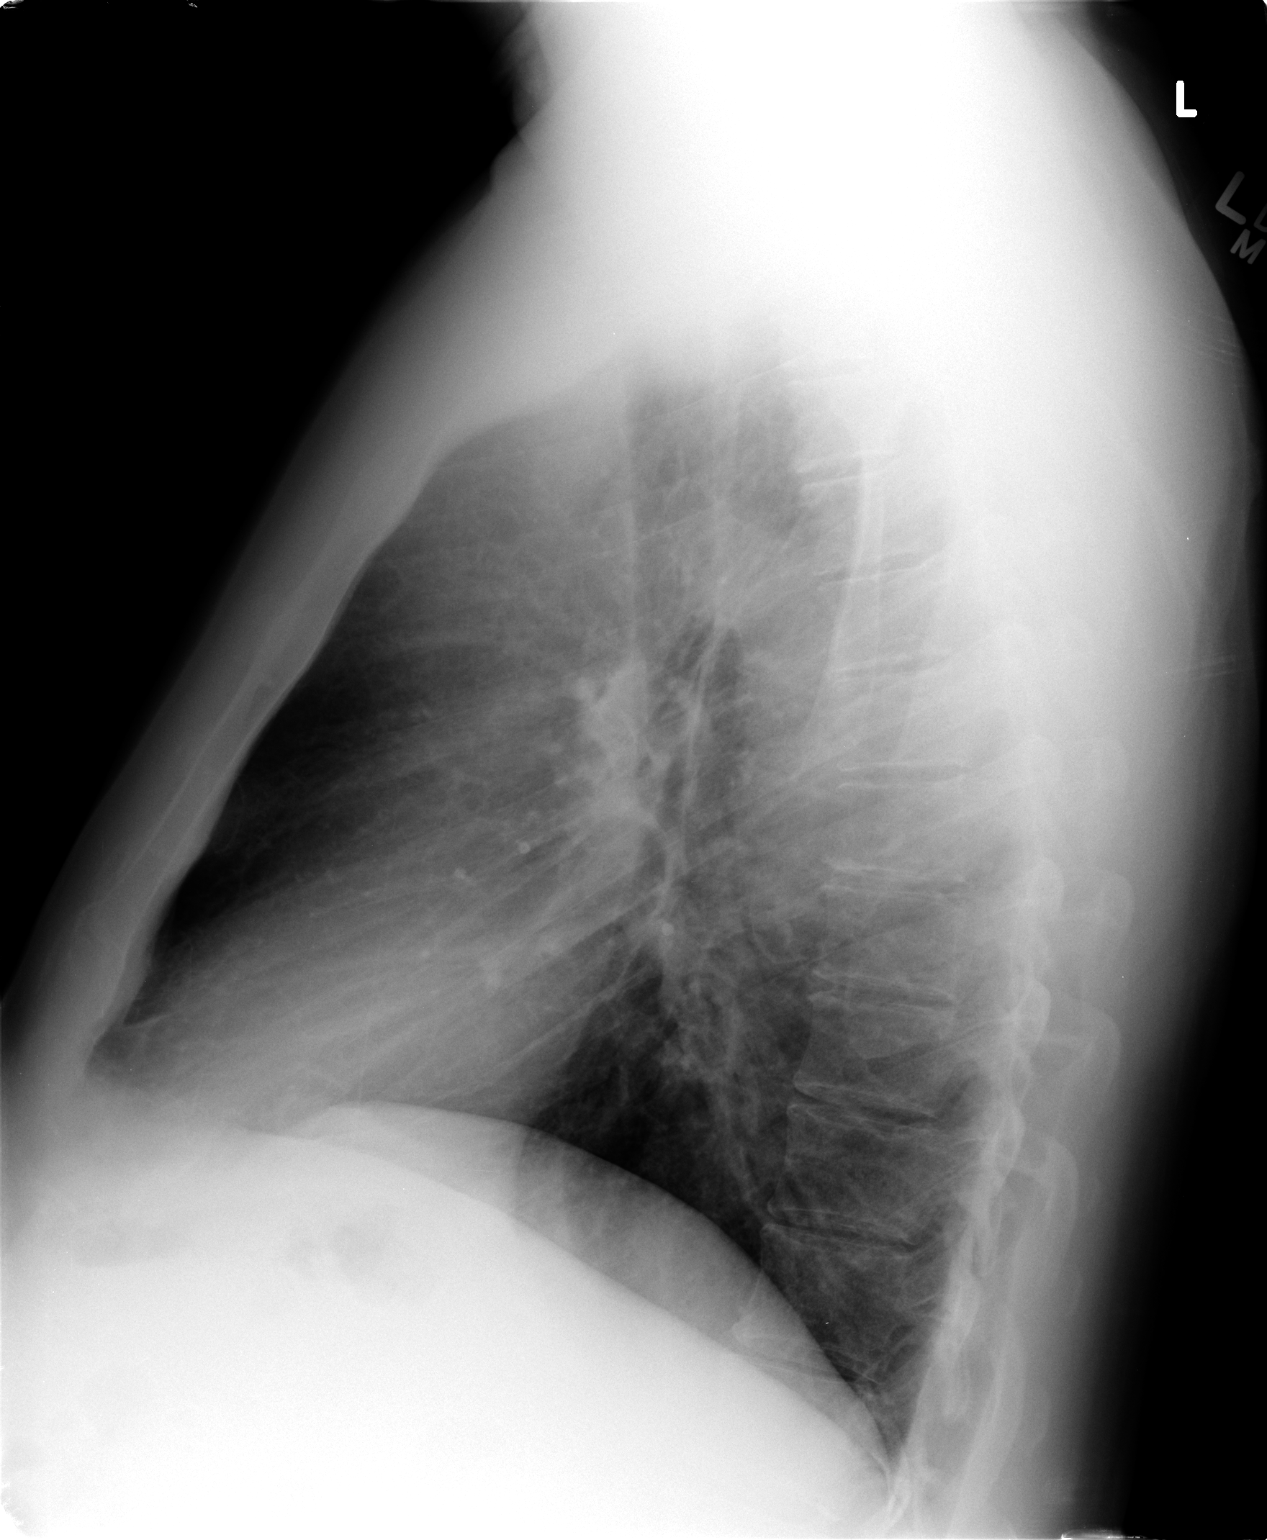

[2 of 2 positions shown; findings below may reference images not displayed]

FINDINGS: The lungs are adequately inflated. There is no focal infiltrate.
There is no pole pulmonary parenchymal mass or nodule demonstrated.
There is no pleural effusion or pneumothorax. The cardiopericardial
silhouette is normal in size. The hilar structures appear normal.
The observed portions of the bony thorax are normal.
IMPRESSION: No active cardiopulmonary disease.

## 2015-11-16 ENCOUNTER — Ambulatory Visit (INDEPENDENT_AMBULATORY_CARE_PROVIDER_SITE_OTHER): Payer: 59 | Admitting: Internal Medicine

## 2015-11-16 ENCOUNTER — Encounter: Payer: Self-pay | Admitting: Internal Medicine

## 2015-11-16 ENCOUNTER — Other Ambulatory Visit (INDEPENDENT_AMBULATORY_CARE_PROVIDER_SITE_OTHER): Payer: 59

## 2015-11-16 VITALS — BP 128/80 | HR 85 | Temp 98.7°F | Wt 246.0 lb

## 2015-11-16 DIAGNOSIS — Z860100 Personal history of colon polyps, unspecified: Secondary | ICD-10-CM

## 2015-11-16 DIAGNOSIS — K219 Gastro-esophageal reflux disease without esophagitis: Secondary | ICD-10-CM

## 2015-11-16 DIAGNOSIS — Z0000 Encounter for general adult medical examination without abnormal findings: Secondary | ICD-10-CM

## 2015-11-16 DIAGNOSIS — M25529 Pain in unspecified elbow: Secondary | ICD-10-CM | POA: Insufficient documentation

## 2015-11-16 DIAGNOSIS — Z23 Encounter for immunization: Secondary | ICD-10-CM | POA: Diagnosis not present

## 2015-11-16 DIAGNOSIS — M25522 Pain in left elbow: Secondary | ICD-10-CM

## 2015-11-16 LAB — CBC WITH DIFFERENTIAL/PLATELET
BASOS PCT: 0.3 % (ref 0.0–3.0)
Basophils Absolute: 0 10*3/uL (ref 0.0–0.1)
Eosinophils Absolute: 0.1 10*3/uL (ref 0.0–0.7)
Eosinophils Relative: 1.4 % (ref 0.0–5.0)
HEMATOCRIT: 43.9 % (ref 39.0–52.0)
Hemoglobin: 14.9 g/dL (ref 13.0–17.0)
LYMPHS ABS: 2.9 10*3/uL (ref 0.7–4.0)
LYMPHS PCT: 30.2 % (ref 12.0–46.0)
MCHC: 33.8 g/dL (ref 30.0–36.0)
MCV: 85.8 fl (ref 78.0–100.0)
MONOS PCT: 9.1 % (ref 3.0–12.0)
Monocytes Absolute: 0.9 10*3/uL (ref 0.1–1.0)
NEUTROS ABS: 5.7 10*3/uL (ref 1.4–7.7)
NEUTROS PCT: 59 % (ref 43.0–77.0)
PLATELETS: 267 10*3/uL (ref 150.0–400.0)
RBC: 5.12 Mil/uL (ref 4.22–5.81)
RDW: 13.8 % (ref 11.5–15.5)
WBC: 9.6 10*3/uL (ref 4.0–10.5)

## 2015-11-16 LAB — BASIC METABOLIC PANEL
BUN: 9 mg/dL (ref 6–23)
CALCIUM: 9.5 mg/dL (ref 8.4–10.5)
CHLORIDE: 106 meq/L (ref 96–112)
CO2: 28 meq/L (ref 19–32)
CREATININE: 1.09 mg/dL (ref 0.40–1.50)
GFR: 75.38 mL/min (ref >60.00)
GLUCOSE: 86 mg/dL (ref 70–99)
Potassium: 4.2 mEq/L (ref 3.5–5.1)
SODIUM: 141 meq/L (ref 135–145)

## 2015-11-16 LAB — HEPATIC FUNCTION PANEL
ALBUMIN: 4.4 g/dL (ref 3.5–5.2)
ALT: 28 U/L (ref 0–53)
AST: 23 U/L (ref 0–37)
Alkaline Phosphatase: 73 U/L (ref 39–117)
Bilirubin, Direct: 0.1 mg/dL (ref 0.0–0.3)
TOTAL PROTEIN: 7.5 g/dL (ref 6.0–8.3)
Total Bilirubin: 0.8 mg/dL (ref 0.2–1.2)

## 2015-11-16 LAB — URINALYSIS
Bilirubin Urine: NEGATIVE
Hgb urine dipstick: NEGATIVE
KETONES UR: NEGATIVE
LEUKOCYTES UA: NEGATIVE
Nitrite: NEGATIVE
PH: 7.5 (ref 5.0–8.0)
SPECIFIC GRAVITY, URINE: 1.01 (ref 1.000–1.030)
Total Protein, Urine: NEGATIVE
Urine Glucose: NEGATIVE
Urobilinogen, UA: 0.2 (ref 0.0–1.0)

## 2015-11-16 LAB — LIPID PANEL
CHOLESTEROL: 195 mg/dL (ref 0–200)
HDL: 37.8 mg/dL — ABNORMAL LOW (ref >39.00)
LDL CALC: 125 mg/dL — AB (ref 0–99)
NONHDL: 157.47
Total CHOL/HDL Ratio: 5
Triglycerides: 164 mg/dL — ABNORMAL HIGH (ref 0.0–149.0)
VLDL: 32.8 mg/dL (ref 0.0–40.0)

## 2015-11-16 LAB — TSH: TSH: 1.27 u[IU]/mL (ref 0.35–4.50)

## 2015-11-16 LAB — PSA: PSA: 0.56 ng/mL (ref 0.10–4.00)

## 2015-11-16 MED ORDER — OMEPRAZOLE 40 MG PO CPDR: 40.0000 mg | DELAYED_RELEASE_CAPSULE | Freq: Every day | ORAL | 5 refills | Status: DC

## 2015-11-16 MED ORDER — VITAMIN D3 50 MCG (2000 UT) PO CAPS: 2000.0000 [IU] | ORAL_CAPSULE | Freq: Every day | ORAL | 3 refills | Status: DC

## 2015-11-16 MED ORDER — MELOXICAM 15 MG PO TABS: 15.0000 mg | ORAL_TABLET | Freq: Every day | ORAL | 0 refills | Status: DC

## 2015-11-16 NOTE — Assessment & Plan Note (Signed)
2016 Dr Hilarie Fredrickson Due colonoscopy 2019

## 2015-11-16 NOTE — Assessment & Plan Note (Addendum)
OTC Omeprazole - re-start EGD 2016 Dr Hilarie Fredrickson  Potential benefits of a long term PPI use as well as potential risks  and complications were explained to the patient and were aknowledged.

## 2015-11-16 NOTE — Progress Notes (Signed)
Pre visit review using our clinic review tool, if applicable. No additional management support is needed unless otherwise documented below in the visit note. 

## 2015-11-16 NOTE — Addendum Note (Signed)
Addended by: Cresenciano Lick on: 11/16/2015 01:17 PM   Modules accepted: Orders

## 2015-11-16 NOTE — Assessment & Plan Note (Addendum)
9/17 2 mo post-contusion Meloxicam x 2 wks X ray if not better

## 2015-11-16 NOTE — Progress Notes (Signed)
Subjective:  Patient ID: Bryan Ward, male    DOB: May 19, 1965  Age: 50 Ward.o. MRN: [id number]  CC: No chief complaint on file.   HPI Bryan Ward [id number] presents for a well exam C/o L elbow pain after he hit his "crazy bone" 2 mo ago... C/o GERD - stopped Omeprazole  Outpatient Medications Prior to Visit  Medication Sig Dispense Refill  . omeprazole (PRILOSEC) 40 MG capsule Take 1 capsule (40 mg total) by mouth daily. (Patient not taking: Reported on 11/16/2015) 30 capsule 3   No facility-administered medications prior to visit.     ROS Review of Systems  Constitutional: Negative for appetite change, fatigue and unexpected weight change.  HENT: Negative for congestion, nosebleeds, sneezing, sore throat and trouble swallowing.   Eyes: Negative for itching and visual disturbance.  Respiratory: Negative for cough.   Cardiovascular: Negative for chest pain, palpitations and leg swelling.  Gastrointestinal: Negative for abdominal distention, blood in stool, diarrhea and nausea.  Genitourinary: Negative for decreased urine volume, frequency and hematuria.  Musculoskeletal: Positive for arthralgias. Negative for back pain, gait problem, joint swelling and neck pain.  Skin: Negative for rash.  Neurological: Negative for dizziness, tremors, speech difficulty and weakness.  Psychiatric/Behavioral: Negative for agitation, dysphoric mood and sleep disturbance. The patient is not nervous/anxious.   L lat elbow is sensitive  Objective:  BP 128/80   Pulse 85   Temp 98.7 F (37.1 C) (Oral)   Wt 246 lb (111.6 kg)   SpO2 95%   BMI 33.36 kg/m   BP Readings from Last 3 Encounters:  11/16/15 128/80  04/14/14 (!) 151/85  03/13/14 118/78    Wt Readings from Last 3 Encounters:  11/16/15 246 lb (111.6 kg)  04/14/14 236 lb (107 kg)  03/13/14 236 lb 2 oz (107.1 kg)    Physical Exam  Constitutional: He is oriented to person, place, and time. He appears well-developed. No distress.    NAD  HENT:  Mouth/Throat: Oropharynx is clear and moist.  Eyes: Conjunctivae are normal. Pupils are equal, round, and reactive to light.  Neck: Normal range of motion. No JVD present. No thyromegaly present.  Cardiovascular: Normal rate, regular rhythm, normal heart sounds and intact distal pulses.  Exam reveals no gallop and no friction rub.   No murmur heard. Pulmonary/Chest: Effort normal and breath sounds normal. No respiratory distress. He has no wheezes. He has no rales. He exhibits no tenderness.  Abdominal: Soft. Bowel sounds are normal. He exhibits no distension and no mass. There is no tenderness. There is no rebound and no guarding.  Musculoskeletal: Normal range of motion. He exhibits no edema or tenderness.  Lymphadenopathy:    He has no cervical adenopathy.  Neurological: He is alert and oriented to person, place, and time. He has normal reflexes. No cranial nerve deficit. He exhibits normal muscle tone. He displays a negative Romberg sign. Coordination and gait normal.  Skin: Skin is warm and dry. No rash noted.  Psychiatric: He has a normal mood and affect. His behavior is normal. Judgment and thought content normal.  pt declined rectal  Lab Results  Component Value Date   WBC 11.0 (H) 01/14/2014   HGB 15.4 01/14/2014   HCT 46.3 01/14/2014   PLT 259.0 01/14/2014   GLUCOSE 95 01/14/2014   CHOL 209 (H) 01/14/2014   TRIG 211.0 (H) 01/14/2014   HDL 36.40 (L) 01/14/2014   LDLDIRECT 135.4 01/14/2014   LDLCALC 129 (H) 01/16/2013   ALT 32 01/14/2014  AST 23 01/14/2014   NA 140 01/14/2014   K 4.3 01/14/2014   CL 108 01/14/2014   CREATININE 1.1 01/14/2014   BUN 16 01/14/2014   CO2 24 01/14/2014   TSH 1.23 01/14/2014   PSA 0.66 01/14/2014    Dg Chest 2 View  Result Date: 01/14/2014 CLINICAL DATA:  Cough and congestion. EXAM: CHEST  2 VIEW COMPARISON:  01/11/2013. FINDINGS: Mediastinum and hilar structures normal. Left lower lobe infiltrate noted consistent with  pneumonia. No pleural effusion or pneumothorax. IMPRESSION: Left lower lobe infiltrate consistent with pneumonia. Electronically Signed   By: Marcello Moores  Register   On: 01/14/2014 14:35    Assessment & Plan:   There are no diagnoses linked to this encounter. I am having Bryan Ward maintain his omeprazole.  No orders of the defined types were placed in this encounter.    Follow-up: No Follow-up on file.  Walker Kehr, MD

## 2015-11-17 LAB — HEPATITIS C ANTIBODY: HCV Ab: NEGATIVE

## 2016-02-16 ENCOUNTER — Encounter: Payer: 59 | Admitting: Internal Medicine

## 2016-05-03 ENCOUNTER — Other Ambulatory Visit: Payer: Self-pay | Admitting: *Deleted

## 2016-05-03 MED ORDER — MELOXICAM 15 MG PO TABS: 15.0000 mg | ORAL_TABLET | Freq: Every day | ORAL | 5 refills | Status: DC

## 2016-05-16 IMAGING — CR DG CHEST 2V
2 series · 2 of 2 positions shown · non-contrast
Comparison: 01/11/2013.

CLINICAL DATA: Cough and congestion.

EXAM:
CHEST  2 VIEW

[view not recorded (1 of 2)]
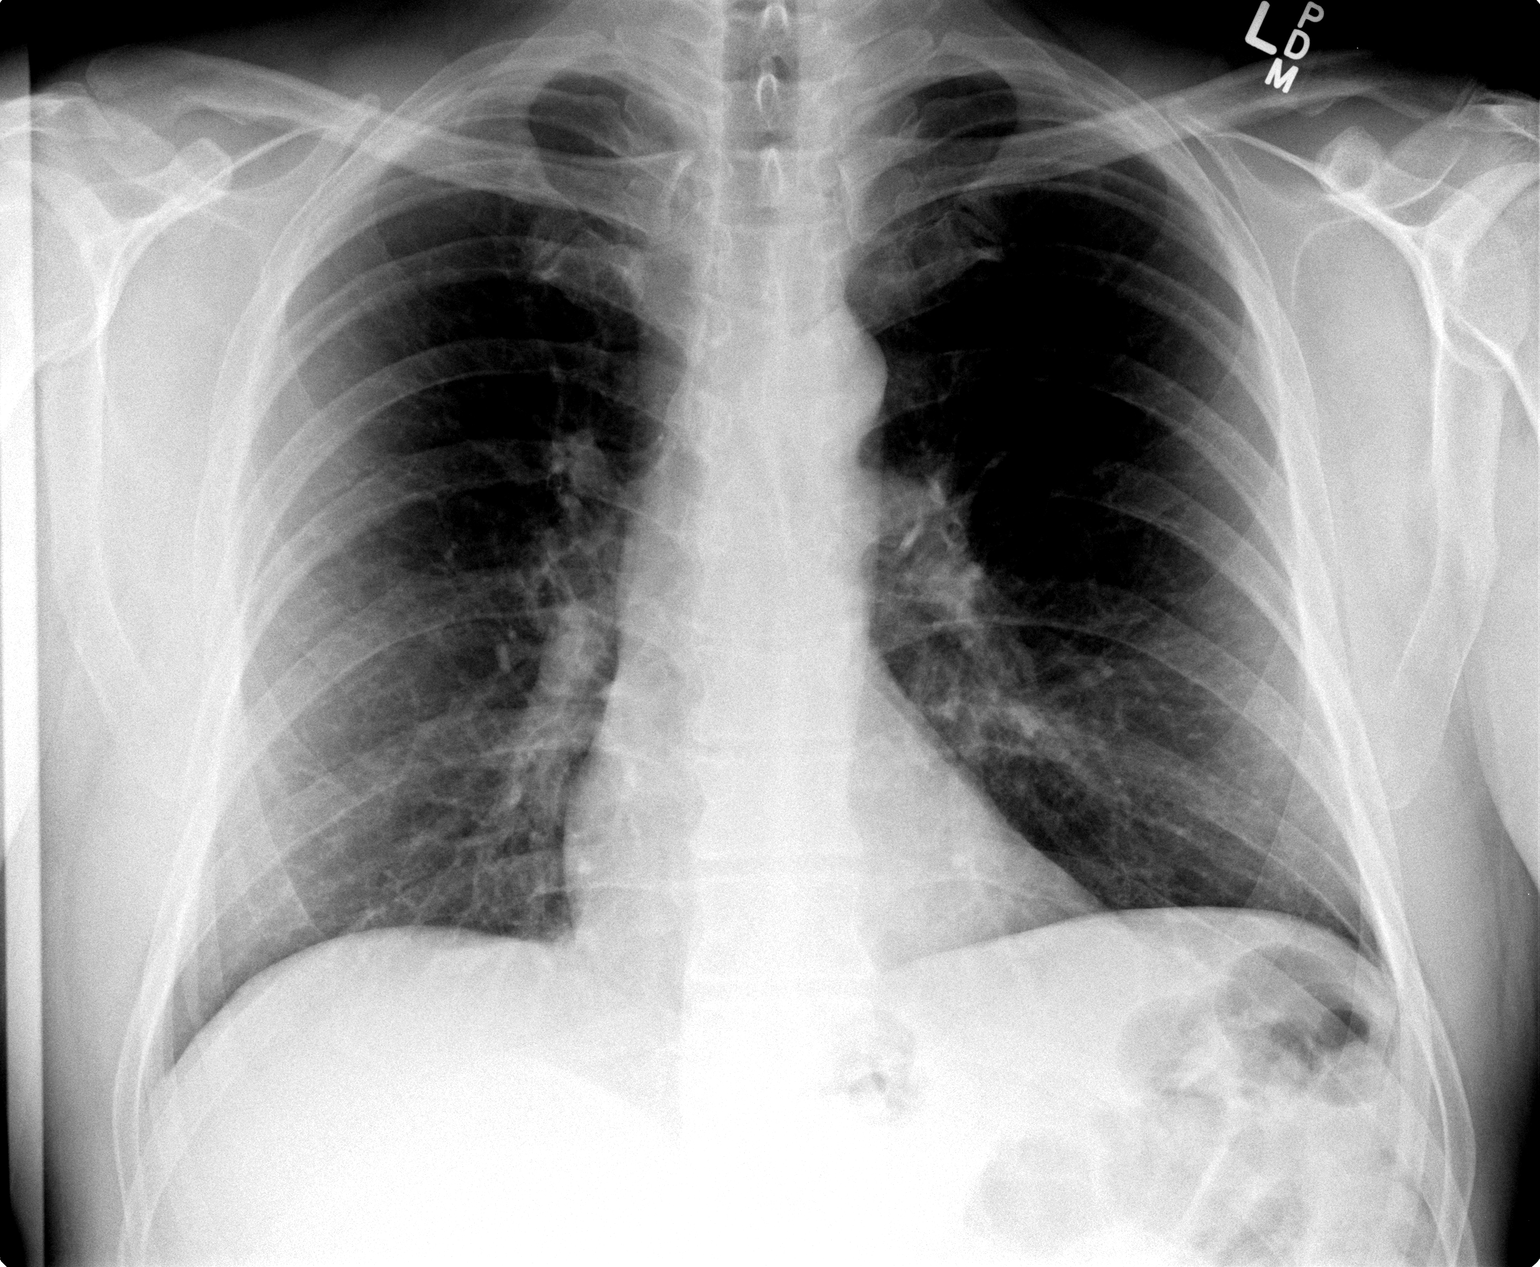

[view not recorded (2 of 2)]
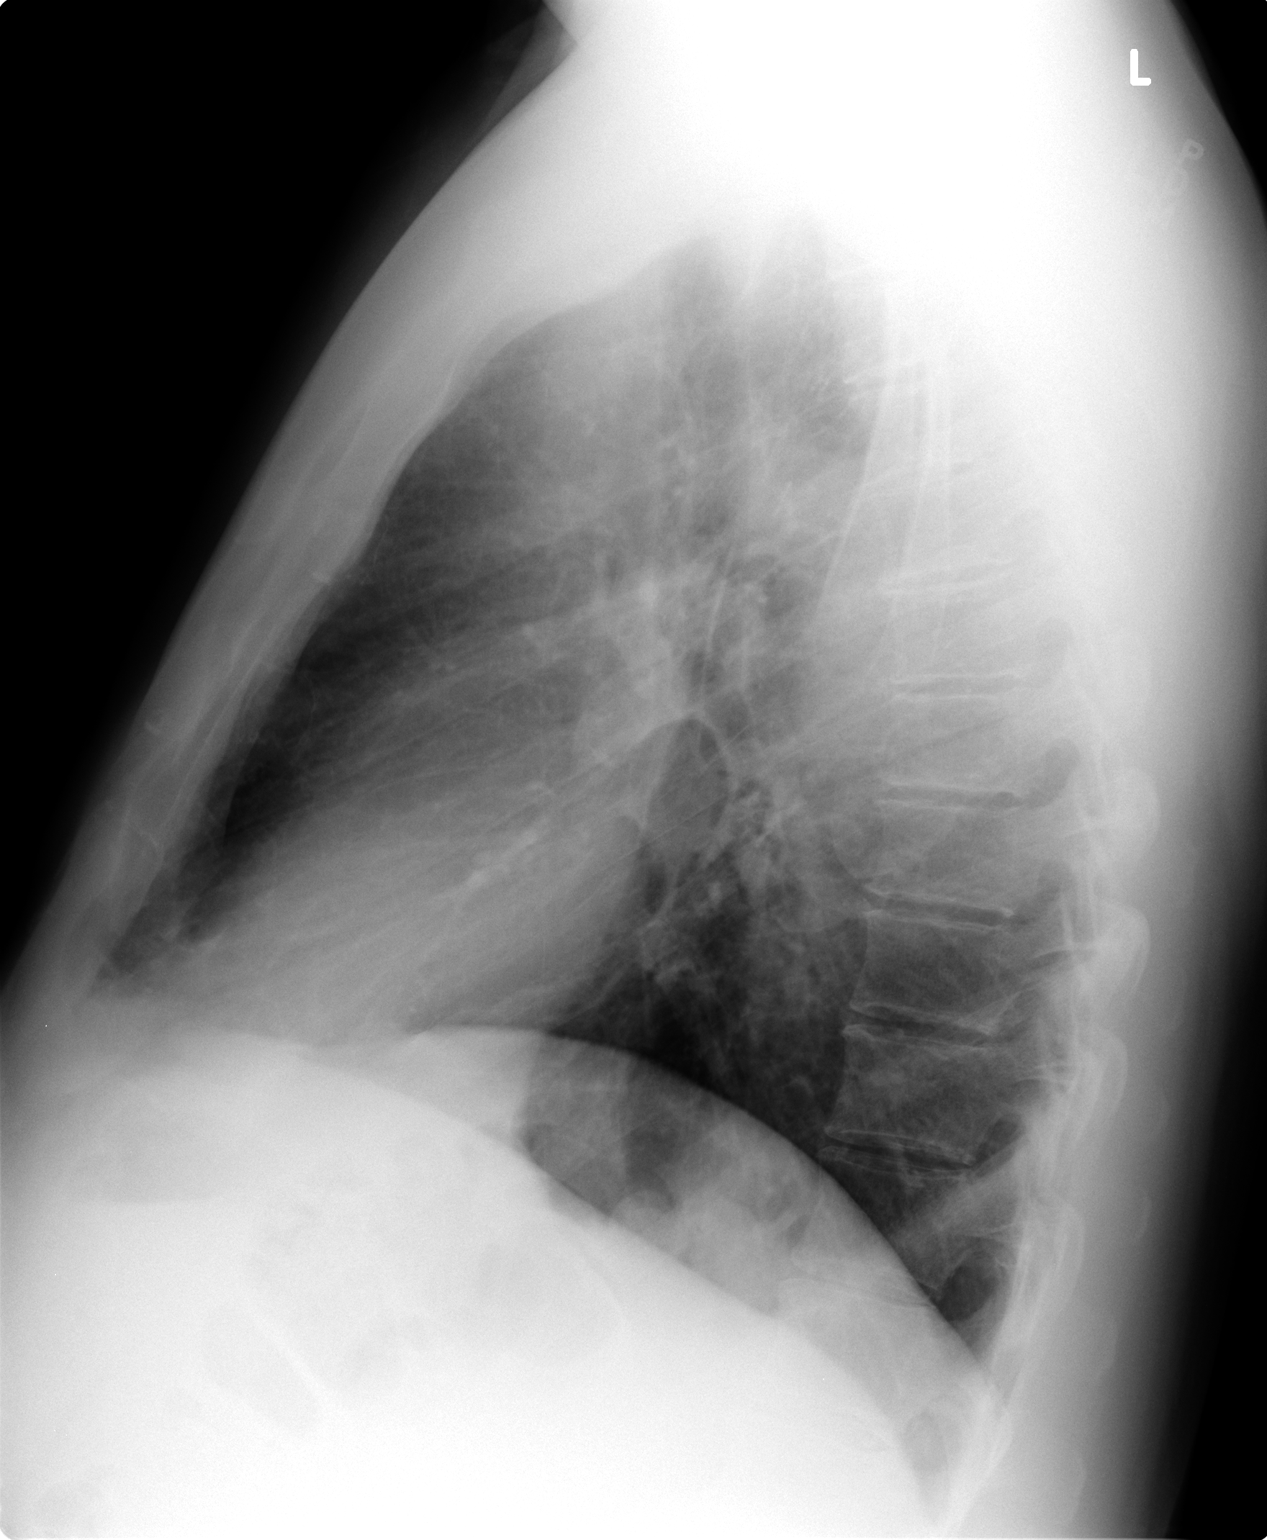

[2 of 2 positions shown; findings below may reference images not displayed]

FINDINGS: Mediastinum and hilar structures normal. Left lower lobe infiltrate
noted consistent with pneumonia. No pleural effusion or
pneumothorax.
IMPRESSION: Left lower lobe infiltrate consistent with pneumonia.

## 2016-06-15 ENCOUNTER — Encounter: Payer: Self-pay | Admitting: Internal Medicine

## 2016-06-15 ENCOUNTER — Ambulatory Visit (INDEPENDENT_AMBULATORY_CARE_PROVIDER_SITE_OTHER): Payer: 59 | Admitting: Internal Medicine

## 2016-06-15 DIAGNOSIS — G510 Bell's palsy: Secondary | ICD-10-CM | POA: Insufficient documentation

## 2016-06-15 DIAGNOSIS — M25522 Pain in left elbow: Secondary | ICD-10-CM

## 2016-06-15 MED ORDER — PREDNISONE 10 MG PO TABS: ORAL_TABLET | ORAL | 1 refills | Status: DC

## 2016-06-15 MED ORDER — B COMPLEX PO TABS: 1.0000 | ORAL_TABLET | Freq: Every day | ORAL | 1 refills | Status: DC

## 2016-06-15 MED ORDER — VALACYCLOVIR HCL 1 G PO TABS: 1000.0000 mg | ORAL_TABLET | Freq: Three times a day (TID) | ORAL | 0 refills | Status: DC

## 2016-06-15 NOTE — Assessment & Plan Note (Signed)
B complex Valtrex Prednisone 10 mg: take 4 tabs a day x 3 days; then 3 tabs a day x 4 days; then 2 tabs a day x 4 days, then 1 tab a day x 6 days, then stop. Take pc. Care discussed

## 2016-06-15 NOTE — Patient Instructions (Signed)
??????? ????? (  [place]) ??????? ????? - ??? ?????????, ??? ??????? ???????????? ????? ?? ????? ??????? ????. ??? ????? ???????? ? ?????????????? "????????" ????. ??? ????? ????????????? ?????????, ? ??????????? ????? ????????? ??????????????. ??????? ????? ????? ???????? ????? ??? ???????? ????? ????? ????:  ????????????.  ???????? ??????.  ????????????? ???????, ????????, ?????????? ??????. ???????  ??????? ????? ?????????? ???????????? ??? ??????????? ???????? ?????. ?????????, ?????? ??? ??????????, ?? ? ?????? ???????? ????? ???????? ???????? ????????. ? ??????????? ??????? ??????? ???????? ???????????. ???????? ? ????????  ???????? ????? ??????????? ?? ?????? ?? ???????, ? ???? ????????? ????? ??????? ? ??????? ?????????? ?????. ?????????? ????? ????????? ????? ????:  ?????????????:  ??????? ???? ????? ??? ??? ?????.  ??????? ???? ??? ??? ?????.  ??????? ????? ?????? ???? (???????? ????).  ???????? ???? ? ???? ???.  ???????? ???? ????.  ??????? ???????? ????.  ?????? ???????? ????????.  ?????????? ???????????????? ? ??????? ??????.  ??????????? ??? ???????.  ???????????? ?? ????? ?? ?????????? ???????.  ??????? ????? ?? ?????????? ???????.  ???????? ??????????????.  ???? ?? ????? ????. ???????  ??????????? ???????? ????? ????? ????????:  ???? ?????? ??????? ??????? ? ?????????? ???????????? ???????.  ???-????????????.  ??-????????????.  ???????????????? (???). ??? ???????????? ????????? ?????????, ??? "????????" ?????. ???????  ??????? ????? ???????? ??????????????? ?????????, ??????????? ????????? ????????????????? ????? ?????????. ?????? ??????? ?? ?????????, ? ???????? ???????? ???? ?? ????. ?????????? ?? ????? ? ???????? ????????   ?????????? ????????????? ????????? ?????? ? ???????????? ? ?????????? ?????.  ??????? ?????? ???? ? ?????????? ? ???????????? ? ?????????????? ?????? ???????? ?????.  ???? ??????? ????:  ??????????? ???????????  ??????? ?????, ????? ????????????? ????????? ?????, ??? ??????? ????? ??????.  ?????????? ?????? ?????, ??? ??????? ????? ??????. ?????????? ? ?????, ????:  ???????? ?? ???????? ??? ?????????? ????.  ? ??? ?????????? ?????????? ??????????????.  ???? ???? ???????, ????????? ??????????? ??? ????. ?????????? ?????????? ? ?????, ????:   ?? ?????????? ???????? ??? ???????? ?????? ????? ????.  ??? ?????? ???????.  ?????? ? ?????????? ???????? ????? ?????????? ?????????.  ? ??? ????????? ???? ? ??????? ???. ?????????, ??? ??:   ????????? ?????? ??????????.  ?????? ?????????????? ??????? ?? ????? ??????????.  ??????????????? ?????????? ? ?????, ???? ??? ?? ?????????? ????? ??? ?????????? ????. ??? ?????????? ?? ????? ???????? ??????, ??????????????? ????? ??????. ??????????? ???????? ????? ???????????? ??? ??????? ? ????? ??????? ??????. Document Released: 01/31/2005 Document Revised: 10/22/2014 Elsevier Interactive Patient Education  2017 [employer].

## 2016-06-15 NOTE — Progress Notes (Signed)
Pre visit review using our clinic review tool, if applicable. No additional management support is needed unless otherwise documented below in the visit note. 

## 2016-06-15 NOTE — Assessment & Plan Note (Signed)
Chronic Prednisone should help  Potential benefits of  steroid  use as well as potential risks  and complications were explained to the patient and were aknowledged.

## 2016-06-15 NOTE — Progress Notes (Addendum)
Subjective:  Patient ID: Bryan Ward, male    DOB: 08-Jan-1966  Age: 51 y.o. MRN: [id number]  CC: No chief complaint on file.   HPI Bryan Ward presents for R face paralysis since Sat night, some numbness in the mouth and cheek F/u elbow pain  Outpatient Medications Prior to Visit  Medication Sig Dispense Refill  . meloxicam (MOBIC) 15 MG tablet Take 1 tablet (15 mg total) by mouth daily. With food (Patient not taking: Reported on 06/15/2016) 15 tablet 5  . Cholecalciferol (VITAMIN D3) 2000 units capsule Take 1 capsule (2,000 Units total) by mouth daily. 100 capsule 3  . omeprazole (PRILOSEC) 40 MG capsule Take 1 capsule (40 mg total) by mouth daily. 30 capsule 5   No facility-administered medications prior to visit.     ROS Review of Systems  Constitutional: Negative for appetite change, fatigue and unexpected weight change.  HENT: Negative for congestion, nosebleeds, sneezing, sore throat and trouble swallowing.   Eyes: Negative for itching and visual disturbance.  Respiratory: Negative for cough.   Cardiovascular: Negative for chest pain, palpitations and leg swelling.  Gastrointestinal: Negative for abdominal distention, blood in stool, diarrhea and nausea.  Genitourinary: Negative for frequency and hematuria.  Musculoskeletal: Negative for back pain, gait problem, joint swelling and neck pain.  Skin: Negative for rash.  Neurological: Positive for weakness. Negative for dizziness, tremors and speech difficulty.  Psychiatric/Behavioral: Negative for agitation, dysphoric mood and sleep disturbance. The patient is not nervous/anxious.     Objective:  BP 128/80 (BP Location: Right Arm, Patient Position: Sitting, Cuff Size: Large)   Pulse 87   Temp 98.7 F (37.1 C) (Oral)   Ht 6' (1.829 m)   Wt 240 lb 1.9 oz (108.9 kg)   SpO2 98%   BMI 32.57 kg/m   BP Readings from Last 3 Encounters:  06/15/16 128/80  11/16/15 128/80  04/14/14 (!) 151/85    Wt Readings  from Last 3 Encounters:  06/15/16 240 lb 1.9 oz (108.9 kg)  11/16/15 246 lb (111.6 kg)  04/14/14 236 lb (107 kg)    Physical Exam  Constitutional: He is oriented to person, place, and time. He appears well-developed. No distress.  NAD  HENT:  Mouth/Throat: Oropharynx is clear and moist.  Eyes: Conjunctivae are normal. Pupils are equal, round, and reactive to light.  Neck: Normal range of motion. No JVD present. No thyromegaly present.  Cardiovascular: Normal rate, regular rhythm, normal heart sounds and intact distal pulses.  Exam reveals no gallop and no friction rub.   No murmur heard. Pulmonary/Chest: Effort normal and breath sounds normal. No respiratory distress. He has no wheezes. He has no rales. He exhibits no tenderness.  Abdominal: Soft. Bowel sounds are normal. He exhibits no distension and no mass. There is no tenderness. There is no rebound and no guarding.  Musculoskeletal: Normal range of motion. He exhibits no edema or tenderness.  Lymphadenopathy:    He has no cervical adenopathy.  Neurological: He is alert and oriented to person, place, and time. He has normal reflexes. A cranial nerve deficit is present. He exhibits normal muscle tone. He displays a negative Romberg sign. Coordination and gait normal.  Skin: Skin is warm and dry. No rash noted.  Psychiatric: He has a normal mood and affect. His behavior is normal. Judgment and thought content normal.  R facial droop  Lab Results  Component Value Date   WBC 9.6 11/16/2015   HGB 14.9 11/16/2015   HCT 43.9  11/16/2015   PLT 267.0 11/16/2015   GLUCOSE 86 11/16/2015   CHOL 195 11/16/2015   TRIG 164.0 (H) 11/16/2015   HDL 37.80 (L) 11/16/2015   LDLDIRECT 135.4 01/14/2014   LDLCALC 125 (H) 11/16/2015   ALT 28 11/16/2015   AST 23 11/16/2015   NA 141 11/16/2015   K 4.2 11/16/2015   CL 106 11/16/2015   CREATININE 1.09 11/16/2015   BUN 9 11/16/2015   CO2 28 11/16/2015   TSH 1.27 11/16/2015   PSA 0.56 11/16/2015     Dg Chest 2 View  Result Date: 01/14/2014 CLINICAL DATA:  Cough and congestion. EXAM: CHEST  2 VIEW COMPARISON:  01/11/2013. FINDINGS: Mediastinum and hilar structures normal. Left lower lobe infiltrate noted consistent with pneumonia. No pleural effusion or pneumothorax. IMPRESSION: Left lower lobe infiltrate consistent with pneumonia. Electronically Signed   By: Marcello Moores  Register   On: 01/14/2014 14:35    Assessment & Plan:   There are no diagnoses linked to this encounter. I have discontinued Bryan Ward omeprazole. I am also having him maintain his meloxicam and Vitamin D3.  Meds ordered this encounter  Medications  . Cholecalciferol (VITAMIN D3) 2000 units capsule    Sig: Take by mouth.     Follow-up: No Follow-up on file.  Walker Kehr, MD

## 2016-06-16 ENCOUNTER — Telehealth: Payer: Self-pay | Admitting: *Deleted

## 2016-06-16 MED ORDER — ACYCLOVIR 800 MG PO TABS: 800.0000 mg | ORAL_TABLET | Freq: Every day | ORAL | 0 refills | Status: AC

## 2016-06-16 NOTE — Telephone Encounter (Signed)
Rec'd fax stating Script for Valacyclovir 1gm was received, but med is not covered under patient plan. The preferred alternative is Acyclovir pls send new script w/strength, directions, and quantity...Johny Chess

## 2016-06-16 NOTE — Telephone Encounter (Signed)
OK. Done Thx 

## 2017-01-13 ENCOUNTER — Other Ambulatory Visit (INDEPENDENT_AMBULATORY_CARE_PROVIDER_SITE_OTHER): Payer: 59

## 2017-01-13 ENCOUNTER — Encounter: Payer: Self-pay | Admitting: Internal Medicine

## 2017-01-13 ENCOUNTER — Ambulatory Visit: Payer: 59 | Admitting: Internal Medicine

## 2017-01-13 VITALS — BP 122/76 | HR 78 | Temp 98.2°F | Ht 72.0 in | Wt 248.0 lb

## 2017-01-13 DIAGNOSIS — D485 Neoplasm of uncertain behavior of skin: Secondary | ICD-10-CM | POA: Insufficient documentation

## 2017-01-13 DIAGNOSIS — Z23 Encounter for immunization: Secondary | ICD-10-CM | POA: Diagnosis not present

## 2017-01-13 DIAGNOSIS — M25522 Pain in left elbow: Secondary | ICD-10-CM | POA: Diagnosis not present

## 2017-01-13 DIAGNOSIS — Z0000 Encounter for general adult medical examination without abnormal findings: Secondary | ICD-10-CM

## 2017-01-13 LAB — BASIC METABOLIC PANEL
BUN: 14 mg/dL (ref 6–23)
CHLORIDE: 109 meq/L (ref 96–112)
CO2: 30 mEq/L (ref 19–32)
CREATININE: 1.2 mg/dL (ref 0.40–1.50)
Calcium: 9.5 mg/dL (ref 8.4–10.5)
GFR: 67.16 mL/min (ref >60.00)
Glucose, Bld: 107 mg/dL — ABNORMAL HIGH (ref 70–99)
Potassium: 4.9 mEq/L (ref 3.5–5.1)
Sodium: 144 mEq/L (ref 135–145)

## 2017-01-13 LAB — URINALYSIS
BILIRUBIN URINE: NEGATIVE
Hgb urine dipstick: NEGATIVE
KETONES UR: NEGATIVE
Leukocytes, UA: NEGATIVE
Nitrite: NEGATIVE
PH: 5 (ref 5.0–8.0)
Total Protein, Urine: NEGATIVE
URINE GLUCOSE: NEGATIVE
UROBILINOGEN UA: 0.2 (ref 0.0–1.0)

## 2017-01-13 LAB — LIPID PANEL
CHOL/HDL RATIO: 6
CHOLESTEROL: 201 mg/dL — AB (ref 0–200)
HDL: 36.5 mg/dL — AB (ref >39.00)
LDL CALC: 133 mg/dL — AB (ref 0–99)
NonHDL: 164.9
TRIGLYCERIDES: 160 mg/dL — AB (ref 0.0–149.0)
VLDL: 32 mg/dL (ref 0.0–40.0)

## 2017-01-13 LAB — CBC WITH DIFFERENTIAL/PLATELET
BASOS PCT: 0.4 % (ref 0.0–3.0)
Basophils Absolute: 0 10*3/uL (ref 0.0–0.1)
EOS ABS: 0.2 10*3/uL (ref 0.0–0.7)
Eosinophils Relative: 3 % (ref 0.0–5.0)
HEMATOCRIT: 47 % (ref 39.0–52.0)
Hemoglobin: 15.6 g/dL (ref 13.0–17.0)
LYMPHS ABS: 3 10*3/uL (ref 0.7–4.0)
LYMPHS PCT: 38.7 % (ref 12.0–46.0)
MCHC: 33.2 g/dL (ref 30.0–36.0)
MCV: 88.4 fl (ref 78.0–100.0)
Monocytes Absolute: 0.8 10*3/uL (ref 0.1–1.0)
Monocytes Relative: 10.7 % (ref 3.0–12.0)
NEUTROS ABS: 3.6 10*3/uL (ref 1.4–7.7)
NEUTROS PCT: 47.2 % (ref 43.0–77.0)
PLATELETS: 245 10*3/uL (ref 150.0–400.0)
RBC: 5.32 Mil/uL (ref 4.22–5.81)
RDW: 14.2 % (ref 11.5–15.5)
WBC: 7.6 10*3/uL (ref 4.0–10.5)

## 2017-01-13 LAB — HEPATIC FUNCTION PANEL
ALBUMIN: 4.4 g/dL (ref 3.5–5.2)
ALT: 27 U/L (ref 0–53)
AST: 20 U/L (ref 0–37)
Alkaline Phosphatase: 62 U/L (ref 39–117)
Bilirubin, Direct: 0.1 mg/dL (ref 0.0–0.3)
TOTAL PROTEIN: 7.5 g/dL (ref 6.0–8.3)
Total Bilirubin: 0.6 mg/dL (ref 0.2–1.2)

## 2017-01-13 LAB — TSH: TSH: 1.51 u[IU]/mL (ref 0.35–4.50)

## 2017-01-13 NOTE — Progress Notes (Signed)
Subjective:  Patient ID: Bryan Ward, male    DOB: 1965/12/28  Age: 51 y.o. MRN: [id number]  CC: No chief complaint on file.   HPI Bryan Ward presents for a well exam C/o L elbow pain x4-5 mo - not better  Outpatient Medications Prior to Visit  Medication Sig Dispense Refill  . b complex vitamins tablet Take 1 tablet by mouth daily. 100 tablet 1  . Cholecalciferol (VITAMIN D3) 2000 units capsule Take by mouth.    . predniSONE (DELTASONE) 10 MG tablet Prednisone 10 mg: take 4 tabs a day x 3 days; then 3 tabs a day x 4 days; then 2 tabs a day x 4 days, then 1 tab a day x 6 days, then stop. Take pc. 38 tablet 1   No facility-administered medications prior to visit.     ROS Review of Systems  Constitutional: Negative for appetite change, fatigue and unexpected weight change.  HENT: Negative for congestion, nosebleeds, sneezing, sore throat and trouble swallowing.   Eyes: Negative for itching and visual disturbance.  Respiratory: Negative for cough.   Cardiovascular: Negative for chest pain, palpitations and leg swelling.  Gastrointestinal: Negative for abdominal distention, blood in stool, diarrhea and nausea.  Genitourinary: Negative for frequency and hematuria.  Musculoskeletal: Positive for arthralgias. Negative for back pain, gait problem, joint swelling and neck pain.  Skin: Negative for rash.  Neurological: Negative for dizziness, tremors, speech difficulty and weakness.  Psychiatric/Behavioral: Negative for agitation, dysphoric mood and sleep disturbance. The patient is not nervous/anxious.     Objective:  BP 122/76 (BP Location: Right Arm, Patient Position: Sitting, Cuff Size: Large)   Pulse 78   Temp 98.2 F (36.8 C) (Oral)   Ht 6' (1.829 m)   Wt 248 lb (112.5 kg)   SpO2 98%   BMI 33.63 kg/m   BP Readings from Last 3 Encounters:  01/13/17 122/76  06/15/16 128/80  11/16/15 128/80    Wt Readings from Last 3 Encounters:  01/13/17 248 lb (112.5  kg)  06/15/16 240 lb 1.9 oz (108.9 kg)  11/16/15 246 lb (111.6 kg)    Physical Exam  Constitutional: He is oriented to person, place, and time. He appears well-developed. No distress.  NAD  HENT:  Mouth/Throat: Oropharynx is clear and moist.  Eyes: Conjunctivae are normal. Pupils are equal, round, and reactive to light.  Neck: Normal range of motion. No JVD present. No thyromegaly present.  Cardiovascular: Normal rate, regular rhythm, normal heart sounds and intact distal pulses. Exam reveals no gallop and no friction rub.  No murmur heard. Pulmonary/Chest: Effort normal and breath sounds normal. No respiratory distress. He has no wheezes. He has no rales. He exhibits no tenderness.  Abdominal: Soft. Bowel sounds are normal. He exhibits no distension and no mass. There is no tenderness. There is no rebound and no guarding.  Musculoskeletal: Normal range of motion. He exhibits tenderness. He exhibits no edema.  Lymphadenopathy:    He has no cervical adenopathy.  Neurological: He is alert and oriented to person, place, and time. He has normal reflexes. No cranial nerve deficit. He exhibits normal muscle tone. He displays a negative Romberg sign. Coordination and gait normal.  Skin: Skin is warm and dry. No rash noted.  Psychiatric: He has a normal mood and affect. His behavior is normal. Judgment and thought content normal.  skin lesions L lat elbow - painful Pt declined rectal exam  Lab Results  Component Value Date   WBC 9.6  11/16/2015   HGB 14.9 11/16/2015   HCT 43.9 11/16/2015   PLT 267.0 11/16/2015   GLUCOSE 86 11/16/2015   CHOL 195 11/16/2015   TRIG 164.0 (H) 11/16/2015   HDL 37.80 (L) 11/16/2015   LDLDIRECT 135.4 01/14/2014   LDLCALC 125 (H) 11/16/2015   ALT 28 11/16/2015   AST 23 11/16/2015   NA 141 11/16/2015   K 4.2 11/16/2015   CL 106 11/16/2015   CREATININE 1.09 11/16/2015   BUN 9 11/16/2015   CO2 28 11/16/2015   TSH 1.27 11/16/2015   PSA 0.56 11/16/2015     Dg Chest 2 View  Result Date: 01/14/2014 CLINICAL DATA:  Cough and congestion. EXAM: CHEST  2 VIEW COMPARISON:  01/11/2013. FINDINGS: Mediastinum and hilar structures normal. Left lower lobe infiltrate noted consistent with pneumonia. No pleural effusion or pneumothorax. IMPRESSION: Left lower lobe infiltrate consistent with pneumonia. Electronically Signed   By: Marcello Moores  Register   On: 01/14/2014 14:35    Assessment & Plan:   There are no diagnoses linked to this encounter. I have discontinued Vennie Y. Milson's predniSONE. I am also having him maintain his Vitamin D3 and b complex vitamins.  No orders of the defined types were placed in this encounter.    Follow-up: No Follow-up on file.  Walker Kehr, MD

## 2017-01-13 NOTE — Patient Instructions (Addendum)
Skin bx w/me     ????????? ?????? (???????????? ??????) ([facility]) ????????? ?????? (??????????? ???????????) - ??? ?????????? ???????? ????????? ?????????? ?????? ?????. ??????????? ????? ????????????? ? ??????. ???????? ????????? ?????????? ???????????? ??? ????????? ????????, ? ??? ??????????? ? ??????? ????? ?????. ????????? ?????? ????? ??????????? ? ?????, ??????? ?????? ? ??????, ?? ?????? ????????? ????? ?????????? ? ?????? ???????? ?????????? ????????????? ????????? ???????? ??? ???????? ??????????. ??????? ??? ????????? ??????????? ???????????? ?????????? ???????? ? ?????????????? ????? ????. ? ???? ????? ???????? ??????? ??????? ??? ??????, ??????? ??????? ????????????? ???????? ??????????. ????????? ?????? ????? ????? ???? ?????????? ???????. ??????? ????? ???? ???????? ?????????? ????? ???????, ???? ?? ??????? ? ?????? ??? ??????????? ?????? ????? ?????? ? ?????????????? ???????. ???? ????? ???????, ???? ?? ????? ? ?????? ??????????? ????? ????. ? ???????? ????? ?????????????? ???????????????? ????:  ?????????.  ????????, ????????? ? ??????????????.  ??????.  ???????.  ????????? ???????.  ?????????.  ???????.  ????, [?????] ???????????? ?????????. ???????? ???????? ????? ??????????? ????????:  ???? ? ????????????? ??? ???????????? ? ??????? ?????????? ? ??????? ????? ?????. ?? ?????? ??????????? ???? ?????? ????? ??????????? ???? ??? ???? ? ?????.  ???????? ??????, ??????? ?? ????? ?? ????.  ?????? ?????? ??????. ??????? ??? ??????????? ????? ???? ??????????????? ?? ?????? ??????? ??????? ? ???????????? ???????. ??? ????? ????? ????????? ???????, ? ??? ?????:  ?????????????????? ????????????.  ??? (????????-??????????? ??????????). ??????? ??? ??????? ???? ????????????? ??? ??????????????? ????? ?????, ????????, ?????? ???????? ? ???????????? ??? ? ????. ??????? ????? ????? ????????:  ????? ????????????????????? ????????. ??? ????? ???? ????? ?????????,  ???? ???? ? ??? ????????????.  ????????????. ??? ????? ???????? ?????? ??? ?????????? ??????????.  ??????? ???????? ????. ???? ???? ? ??? ?? ????????, ???????? ?? ???????, ? ???????? ????? ????? ???? ????????????? ????????????? ?????????????. ?????????? ?? ????? ? ???????? ???????? ???? ????????????  ?????????? ????? ???????? ? ?????, ? ???????????? ? ?????????????? ???????? ?????. ?????????? ???????? ????? ????????, ??????? ??????? ?????? ?????????, ???? ??? ???? ?? ??????, ??? ?? ?????? ????? ????????? ??.  ???? ????????????? ?????? ??? ???????????, ??????? ?? ???, ??? ???????.  ???? ?? ???-?? ??????????, ??????? ???? ??????? ?????. ??? ??????? ???????? ?? ??????. ????? ?????  ???? ????????? ?????? ?????????? ????????? ???????, ????????? ???????????? ? ?????????, ???: ? ?? ????????? ??? ????????????. ? ??? ????????? ??????? ???????? ??? ???.  ???? ????????? ?????? ?????????? ???????, ??????? ????? ????????, ???? ??? ????????. ?????????? ? ????? ?????????? ? ???, ??? ????? ????????? ??????? ?????????? ??? ??? ???????. ? ???? ????????? ?????? ?????????? ?????????????? ??????????, ?????????? ? ????? ?????????? ? ??? ??? ???? ?????????? ? ????? ??????? ?????. ????? ????????  ????????????? ??? ? ?????????????? ???????, ???? ??? ??? ?????????: ? ???????? ??? ? ??????????? ?????. ? ???????? ????? ??????????. ? ??????? ???????? ??????? ???????????? ?? 20 ????? 2-3 ???? ? ????.  ?????????? ????????????? ????????? ?????? ? ???????????? ? ?????????? ?????.  ???? ??? ?????? ????, ?????? ?? ? ???????????? ? ?????????????? ???????? ?????.  ????????? ? ????? ?? ??? ??????????? ??????. ??? ?????. ?????????? ? ?????, ????:  ???????? ?? ??????? ???? ? ??? ?? ????????.  ???? ?????????.  ? ??? ????????? ???????? ??? ???????? ? ??????????, ????? ??? ???????. ??? ?????????? ?? ????? ???????? ??????, ??????????????? ????? ??????. ??????????? ???????? ????? ???????????? ??? ??????? ? ????? ???????  ??????. Document Released: 01/31/2005 Document Revised: 06/17/2014 Document Reviewed: 01/27/2014 Elsevier Interactive Patient Education  [employer].

## 2017-01-13 NOTE — Assessment & Plan Note (Signed)
We discussed age appropriate health related issues, including available/recomended screening tests and vaccinations. We discussed a need for adhering to healthy diet and exercise. Labs/EKG were reviewed/ordered. All questions were answered.   

## 2017-01-13 NOTE — Assessment & Plan Note (Signed)
R arm Skin bx

## 2017-01-13 NOTE — Assessment & Plan Note (Signed)
worse: L lat epicondylitis Will ref to Dr Tamala Julian

## 2017-01-31 NOTE — Progress Notes (Signed)
[facility] [address], [place] [address] Phone: [phone] Subjective:    I'm seeing this patient by the request  of:  Plotnikov, Evie Lacks, MD   CC: Left elbow pain  WER:[id number]  Bryan Ward is a 51 y.o. male coming in with complaint of left elbow pain for over 1 year. He said he may have hurt it from exercising.  He does recall hitting his medial epicondyle at work last summer as well. Patient has pain when he makes a fist. He feels the pain all around the elbow. Denies any radiating symptoms. He has tried topical cream. He did use meloxicam which does alleviate his pain. 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  Patient rates the severity of pain is 5 out of 10.  States that he has been trying to avoid heavy lifting but still has some discomfort in the area.      Past Medical History:  Diagnosis Date  . Helicobacter pylori antibody positive    Past Surgical History:  Procedure Laterality Date  . KNEE ARTHROSCOPY W/ LASER Left 2002   Dr Noemi Chapel   Social History   Socioeconomic History  . Marital status: Married    Spouse name: None  . Number of children: 1  . Years of education: None  . Highest education level: None  Social Needs  . Financial resource strain: None  . Food insecurity - worry: None  . Food insecurity - inability: None  . Transportation needs - medical: None  . Transportation needs - non-medical: None  Occupational History  . Occupation: Glass blower/designer  Tobacco Use  . Smoking status: Former Smoker    Packs/day: 0.25    Types: Cigarettes    Last attempt to quit: 10/15/2013    Years since quitting: 3.3  . Smokeless tobacco: Never Used  Substance and Sexual Activity  . Alcohol use: Yes    Alcohol/week: 0.0 oz    Comment: Occassionally  . Drug use: No  . Sexual activity: Yes  Other Topics Concern  . None  Social History Narrative  . None   No Known Allergies Family  History  Problem Relation Age of Onset  . Arthritis Father   . Esophageal cancer Father 62  . Colon cancer Neg Hx   . Colon polyps Neg Hx   . Diabetes Neg Hx   . Kidney disease Neg Hx   . Heart disease Neg Hx      Past medical history, social, surgical and family history all reviewed in electronic medical record.  No pertanent information unless stated regarding to the chief complaint.   Review of Systems:Review of systems updated and as accurate as of 02/01/17  No headache, visual changes, nausea, vomiting, diarrhea, constipation, dizziness, abdominal pain, skin rash, fevers, chills, night sweats, weight loss, swollen lymph nodes, body aches, joint swelling, muscle aches, chest pain, shortness of breath, mood changes.  Positive muscle aches  Objective  Blood pressure 118/84, pulse 79, height 6' (1.829 m), weight 250 lb (113.4 kg), SpO2 96 %. Systems examined below as of 02/01/17   General: No apparent distress alert and oriented x3 mood and affect normal, dressed appropriately.  HEENT: Pupils equal, extraocular movements intact  Respiratory: Patient's speak in full sentences and does not appear short of breath  Cardiovascular: No lower extremity edema, non tender, no erythema  Skin: Warm dry intact with no signs of infection or rash on extremities  or on axial skeleton.  Abdomen: Soft nontender  Neuro: Cranial nerves II through XII are intact, neurovascularly intact in all extremities with 2+ DTRs and 2+ pulses.  Lymph: No lymphadenopathy of posterior or anterior cervical chain or axillae bilaterally.  Gait normal with good balance and coordination.  MSK:  Non tender with full range of motion and good stability and symmetric strength and tone of shoulders,  wrist, hip, knee and ankles bilaterally.  Elbow: Left Unremarkable to inspection. Range of motion full pronation, supination, flexion, extension. Strength is full to all of the above directions Stable to varus, valgus stress.  Negative moving valgus stress test. Mild discomfort over the lateral epicondylar region. Ulnar nerve does not sublux. Negative cubital tunnel Tinel's.  Musculoskeletal ultrasound was performed and interpreted by Charlann Boxer D.O.   Elbow:  Lateral epicondyle and common extensor tendon origin visualized.  Very mild hypoechoic changes on the superficial aspect of the tendon.  No avulsion fracture noted Radial head unremarkable and located in annular ligament Medial epicondyle and common flexor tendon origin visualized.  No edema, effusions, or avulsions seen. Ulnar nerve in cubital tunnel unremarkable. Olecranon and triceps insertion visualized and unremarkable without edema, effusion, or avulsion.  No signs olecranon bursitis. Power doppler signal normal.  IMPRESSION: Mild lateral epicondylitis  [id number]; 15 additional minutes spent for Therapeutic exercises as stated in above notes.  This included exercises focusing on stretching, strengthening, with significant focus on eccentric aspects.   Long term goals include an improvement in range of motion, strength, endurance as well as avoiding reinjury. Patient's frequency would include in 1-2 times a day, 3-5 times a week for a duration of 6-12 weeks. Lateral Epicondylitis: Elbow anatomy was reviewed, and tendinopathy was explained.  Pt. given a formal rehab program. Series of concentric and eccentric exercises should be done starting with no weight, work up to 1 lb, hammer, etc.  Use counterforce strap if working or using hands.  Formal PT would be beneficial. Emphasized stretching an cross-friction massage Emphasized proper palms up lifting biomechanics to unload ECRB  Proper technique shown and discussed handout in great detail with ATC.  All questions were discussed and answered.     Impression and Recommendations:     This case required medical decision making of moderate complexity.      Note: This dictation was prepared with  Dragon dictation along with smaller phrase technology. Any transcriptional errors that result from this process are unintentional.

## 2017-02-01 ENCOUNTER — Encounter: Payer: Self-pay | Admitting: Family Medicine

## 2017-02-01 ENCOUNTER — Ambulatory Visit: Payer: Self-pay

## 2017-02-01 ENCOUNTER — Ambulatory Visit: Payer: 59 | Admitting: Family Medicine

## 2017-02-01 VITALS — BP 118/84 | HR 79 | Ht 72.0 in | Wt 250.0 lb

## 2017-02-01 DIAGNOSIS — M25522 Pain in left elbow: Secondary | ICD-10-CM | POA: Diagnosis not present

## 2017-02-01 DIAGNOSIS — M7712 Lateral epicondylitis, left elbow: Secondary | ICD-10-CM

## 2017-02-01 NOTE — Patient Instructions (Signed)
Good to see you.  Ice 20 minutes 2 times daily. Usually after activity and before bed. Exercises 3 times a week.  Wear wrist brace day and night for 2 weeks then nightly for another 2 weeks.  Avoid wrist extension if you can  No heavy lifting.  See me again in 4 weeks

## 2017-02-01 NOTE — Assessment & Plan Note (Signed)
Left lateral epicondylar region patient given a wrist brace to avoid extension.  Home exercises given.  Topical anti-inflammatories given.  We discussed avoiding certain lifting activities.  Follow-up again in 4 weeks

## 2017-02-06 ENCOUNTER — Ambulatory Visit (INDEPENDENT_AMBULATORY_CARE_PROVIDER_SITE_OTHER): Payer: 59 | Admitting: Internal Medicine

## 2017-02-06 ENCOUNTER — Encounter: Payer: Self-pay | Admitting: Internal Medicine

## 2017-02-06 ENCOUNTER — Other Ambulatory Visit: Payer: 59

## 2017-02-06 VITALS — BP 146/82 | HR 81 | Temp 97.8°F | Ht 72.0 in | Wt 252.0 lb

## 2017-02-06 DIAGNOSIS — D485 Neoplasm of uncertain behavior of skin: Secondary | ICD-10-CM

## 2017-02-06 DIAGNOSIS — M7712 Lateral epicondylitis, left elbow: Secondary | ICD-10-CM

## 2017-02-06 DIAGNOSIS — L919 Hypertrophic disorder of the skin, unspecified: Secondary | ICD-10-CM | POA: Diagnosis not present

## 2017-02-06 DIAGNOSIS — Z0000 Encounter for general adult medical examination without abnormal findings: Secondary | ICD-10-CM | POA: Diagnosis not present

## 2017-02-06 DIAGNOSIS — L570 Actinic keratosis: Secondary | ICD-10-CM | POA: Diagnosis not present

## 2017-02-06 NOTE — Progress Notes (Signed)
Subjective:  Patient ID: Bryan Ward, male    DOB: 1965-04-07  Age: 51 y.o. MRN: [id number]  CC: No chief complaint on file.   HPI Bryan Ward presents for a skin bx. F/u elbow pain. PSA was not done. Pt refused to have blood re-drawn  Outpatient Medications Prior to Visit  Medication Sig Dispense Refill  . b complex vitamins tablet Take 1 tablet by mouth daily. 100 tablet 1  . Cholecalciferol (VITAMIN D3) 2000 units capsule Take by mouth.     No facility-administered medications prior to visit.     ROS Review of Systems  Constitutional: Negative for fever.  Musculoskeletal: Positive for arthralgias.    Objective:  BP (!) 146/82 (BP Location: Right Arm, Patient Position: Sitting, Cuff Size: Large)   Pulse 81   Temp 97.8 F (36.6 C) (Oral)   Ht 6' (1.829 m)   Wt 252 lb (114.3 kg)   SpO2 98%   BMI 34.18 kg/m   BP Readings from Last 3 Encounters:  02/06/17 (!) 146/82  02/01/17 118/84  01/13/17 122/76    Wt Readings from Last 3 Encounters:  02/06/17 252 lb (114.3 kg)  02/01/17 250 lb (113.4 kg)  01/13/17 248 lb (112.5 kg)    Physical Exam  Wrist splint Moles, skin growths   Procedure Note :     Procedure :  Skin biopsy   Indication:  Changing mole (s ),  Suspicious lesion(s)   Risks including unsuccessful procedure , bleeding, infection, bruising, scar, a need for another complete procedure and others were explained to the patient in detail as well as the benefits. Informed consent was obtained.  The patient was placed in a decubitus position.  Lesion #1 on  Upper back   measuring 6x6    mm   Skin over lesion #1  was prepped with Betadine and alcohol  and anesthetized with 1 cc of 2% lidocaine and epinephrine, using a 25-gauge 1 inch needle.  Shave biopsy with a sterile Dermablade was carried out in the usual fashion. Hyfrecator was used to destroy the rest of the lesion potentially left behind and for hemostasis. Band-Aid was applied with  antibiotic ointment.    Lesion #2 on R chest wall   measuring 4x4 mm   Skin over lesion #2  was prepped with Betadine and alcohol  and anesthetized with 1 cc of 2% lidocaine and epinephrine, using a 25-gauge 1 inch needle.  Shave biopsy with a sterile Dermablade was carried out in the usual fashion. Hyfrecator was used to destroy the rest of the lesion potentially left behind and for hemostasis. Band-Aid was applied with antibiotic ointment.  Lesion #3 on  R axilla  measuring  2x4 mm   Skin over lesion #3  was prepped with Betadine and alcohol  and anesthetized with 1 cc of 2% lidocaine and epinephrine, using a 25-gauge 1 inch needle.  Shave biopsy with a sterile Dermablade was carried out in the usual fashion. Hyfrecator was used to destroy the rest of the lesion potentially left behind and for hemostasis. Band-Aid was applied with antibiotic ointment. Specimen was discarded.   Tolerated well. Complications none.  Skin tags x4 removed at no charge    Postprocedure instructions :    A Band-Aid should be  changed twice daily. You can take a shower tomorrow.  Keep the wounds clean. You can wash them with liquid soap and water. Pat dry with gauze or a Kleenex tissue  Before applying antibiotic ointment  and a Band-Aid.   You need to report immediately  if fever, chills or any signs of infection develop.    The biopsy results should be available in 1 -2 weeks.   Lab Results  Component Value Date   WBC 7.6 01/13/2017   HGB 15.6 01/13/2017   HCT 47.0 01/13/2017   PLT 245.0 01/13/2017   GLUCOSE 107 (H) 01/13/2017   CHOL 201 (H) 01/13/2017   TRIG 160.0 (H) 01/13/2017   HDL 36.50 (L) 01/13/2017   LDLDIRECT 135.4 01/14/2014   LDLCALC 133 (H) 01/13/2017   ALT 27 01/13/2017   AST 20 01/13/2017   NA 144 01/13/2017   K 4.9 01/13/2017   CL 109 01/13/2017   CREATININE 1.20 01/13/2017   BUN 14 01/13/2017   CO2 30 01/13/2017   TSH 1.51 01/13/2017   PSA 0.56 11/16/2015    Dg Chest 2  View  Result Date: 01/14/2014 CLINICAL DATA:  Cough and congestion. EXAM: CHEST  2 VIEW COMPARISON:  01/11/2013. FINDINGS: Mediastinum and hilar structures normal. Left lower lobe infiltrate noted consistent with pneumonia. No pleural effusion or pneumothorax. IMPRESSION: Left lower lobe infiltrate consistent with pneumonia. Electronically Signed   By: Marcello Moores  Register   On: 01/14/2014 14:35    Assessment & Plan:   There are no diagnoses linked to this encounter. I am having Bryan Ward maintain his Vitamin D3 and b complex vitamins.  No orders of the defined types were placed in this encounter.    Follow-up: No Follow-up on file.  Walker Kehr, MD

## 2017-02-09 ENCOUNTER — Encounter: Payer: Self-pay | Admitting: Internal Medicine

## 2017-02-09 NOTE — Assessment & Plan Note (Signed)
See bx °

## 2017-02-09 NOTE — Assessment & Plan Note (Signed)
Wrist splint - better

## 2017-03-13 ENCOUNTER — Encounter: Payer: Self-pay | Admitting: *Deleted

## 2017-04-22 ENCOUNTER — Encounter: Payer: Self-pay | Admitting: Internal Medicine

## 2018-01-05 ENCOUNTER — Encounter: Payer: Self-pay | Admitting: Internal Medicine

## 2018-01-15 ENCOUNTER — Encounter: Payer: Self-pay | Admitting: Internal Medicine

## 2018-01-15 ENCOUNTER — Ambulatory Visit (INDEPENDENT_AMBULATORY_CARE_PROVIDER_SITE_OTHER): Payer: 59 | Admitting: Internal Medicine

## 2018-01-15 ENCOUNTER — Other Ambulatory Visit (INDEPENDENT_AMBULATORY_CARE_PROVIDER_SITE_OTHER): Payer: 59

## 2018-01-15 VITALS — BP 132/82 | HR 76 | Temp 97.6°F | Ht 72.0 in | Wt 253.0 lb

## 2018-01-15 DIAGNOSIS — Z0000 Encounter for general adult medical examination without abnormal findings: Secondary | ICD-10-CM | POA: Diagnosis not present

## 2018-01-15 DIAGNOSIS — Z23 Encounter for immunization: Secondary | ICD-10-CM | POA: Diagnosis not present

## 2018-01-15 DIAGNOSIS — K219 Gastro-esophageal reflux disease without esophagitis: Secondary | ICD-10-CM

## 2018-01-15 LAB — URINALYSIS
Bilirubin Urine: NEGATIVE
Hgb urine dipstick: NEGATIVE
Ketones, ur: NEGATIVE
Leukocytes, UA: NEGATIVE
Nitrite: NEGATIVE
Specific Gravity, Urine: 1.02 (ref 1.000–1.030)
Total Protein, Urine: NEGATIVE
Urine Glucose: NEGATIVE
Urobilinogen, UA: 0.2 (ref 0.0–1.0)
pH: 6 (ref 5.0–8.0)

## 2018-01-15 LAB — LIPID PANEL
Cholesterol: 196 mg/dL (ref 0–200)
HDL: 37.5 mg/dL — ABNORMAL LOW (ref >39.00)
NonHDL: 158.44
Total CHOL/HDL Ratio: 5
Triglycerides: 208 mg/dL — ABNORMAL HIGH (ref 0.0–149.0)
VLDL: 41.6 mg/dL — ABNORMAL HIGH (ref 0.0–40.0)

## 2018-01-15 LAB — CBC WITH DIFFERENTIAL/PLATELET
Basophils Absolute: 0 10*3/uL (ref 0.0–0.1)
Basophils Relative: 0.3 % (ref 0.0–3.0)
EOS PCT: 2.1 % (ref 0.0–5.0)
Eosinophils Absolute: 0.2 10*3/uL (ref 0.0–0.7)
HCT: 45.4 % (ref 39.0–52.0)
Hemoglobin: 15.3 g/dL (ref 13.0–17.0)
Lymphocytes Relative: 34.1 % (ref 12.0–46.0)
Lymphs Abs: 2.6 10*3/uL (ref 0.7–4.0)
MCHC: 33.8 g/dL (ref 30.0–36.0)
MCV: 86.4 fl (ref 78.0–100.0)
MONO ABS: 0.7 10*3/uL (ref 0.1–1.0)
Monocytes Relative: 9 % (ref 3.0–12.0)
Neutro Abs: 4.2 10*3/uL (ref 1.4–7.7)
Neutrophils Relative %: 54.5 % (ref 43.0–77.0)
Platelets: 246 10*3/uL (ref 150.0–400.0)
RBC: 5.25 Mil/uL (ref 4.22–5.81)
RDW: 13.7 % (ref 11.5–15.5)
WBC: 7.8 10*3/uL (ref 4.0–10.5)

## 2018-01-15 LAB — BASIC METABOLIC PANEL
BUN: 11 mg/dL (ref 6–23)
CO2: 28 mEq/L (ref 19–32)
CREATININE: 1.06 mg/dL (ref 0.40–1.50)
Calcium: 9.8 mg/dL (ref 8.4–10.5)
Chloride: 105 mEq/L (ref 96–112)
GFR: 77.21 mL/min (ref >60.00)
Glucose, Bld: 100 mg/dL — ABNORMAL HIGH (ref 70–99)
Potassium: 4.6 mEq/L (ref 3.5–5.1)
Sodium: 141 mEq/L (ref 135–145)

## 2018-01-15 LAB — HEPATIC FUNCTION PANEL
ALT: 26 U/L (ref 0–53)
AST: 19 U/L (ref 0–37)
Albumin: 4.4 g/dL (ref 3.5–5.2)
Alkaline Phosphatase: 63 U/L (ref 39–117)
Bilirubin, Direct: 0.1 mg/dL (ref 0.0–0.3)
Total Bilirubin: 0.6 mg/dL (ref 0.2–1.2)
Total Protein: 7.2 g/dL (ref 6.0–8.3)

## 2018-01-15 LAB — PSA: PSA: 0.57 ng/mL (ref 0.10–4.00)

## 2018-01-15 LAB — TSH: TSH: 1.43 u[IU]/mL (ref 0.35–4.50)

## 2018-01-15 LAB — LDL CHOLESTEROL, DIRECT: Direct LDL: 135 mg/dL

## 2018-01-15 MED ORDER — OMEPRAZOLE 40 MG PO CPDR: 40.0000 mg | DELAYED_RELEASE_CAPSULE | Freq: Every day | ORAL | 3 refills | Status: DC

## 2018-01-15 NOTE — Assessment & Plan Note (Signed)
Worse on Zantac D/c Zantac Start Prilosec F/u w/dr Hilarie Fredrickson

## 2018-01-15 NOTE — Addendum Note (Signed)
Addended by: Karren Cobble on: 01/15/2018 11:53 AM   Modules accepted: Orders

## 2018-01-15 NOTE — Progress Notes (Signed)
Subjective:  Patient ID: Bryan Ward, male    DOB: 11/04/1965  Age: 52 y.o. MRN: [id number]  CC: No chief complaint on file.   HPI Bryan Ward presents for well exam  Outpatient Medications Prior to Visit  Medication Sig Dispense Refill  . b complex vitamins tablet Take 1 tablet by mouth daily. 100 tablet 1  . Cholecalciferol (VITAMIN D3) 2000 units capsule Take by mouth.     No facility-administered medications prior to visit.     ROS: Review of Systems  Constitutional: Negative for appetite change, fatigue and unexpected weight change.  HENT: Negative for congestion, nosebleeds, sneezing, sore throat and trouble swallowing.   Eyes: Negative for itching and visual disturbance.  Respiratory: Negative for cough.   Cardiovascular: Negative for chest pain, palpitations and leg swelling.  Gastrointestinal: Negative for abdominal distention, blood in stool, diarrhea and nausea.  Genitourinary: Negative for frequency and hematuria.  Musculoskeletal: Negative for back pain, gait problem, joint swelling and neck pain.  Skin: Negative for rash.  Neurological: Negative for dizziness, tremors, speech difficulty and weakness.  Psychiatric/Behavioral: Negative for agitation, dysphoric mood and sleep disturbance. The patient is not nervous/anxious.   GERD  Objective:  BP 132/82 (BP Location: Right Arm, Patient Position: Sitting, Cuff Size: Large)   Pulse 76   Temp 97.6 F (36.4 C) (Oral)   Ht 6' (1.829 m)   Wt 253 lb (114.8 kg)   SpO2 96%   BMI 34.31 kg/m   BP Readings from Last 3 Encounters:  01/15/18 132/82  02/06/17 (!) 146/82  02/01/17 118/84    Wt Readings from Last 3 Encounters:  01/15/18 253 lb (114.8 kg)  02/06/17 252 lb (114.3 kg)  02/01/17 250 lb (113.4 kg)    Physical Exam  Constitutional: He is oriented to person, place, and time. He appears well-developed. No distress.  NAD  HENT:  Mouth/Throat: Oropharynx is clear and moist.  Eyes: Pupils  are equal, round, and reactive to light. Conjunctivae are normal.  Neck: Normal range of motion. No JVD present. No thyromegaly present.  Cardiovascular: Normal rate, regular rhythm, normal heart sounds and intact distal pulses. Exam reveals no gallop and no friction rub.  No murmur heard. Pulmonary/Chest: Effort normal and breath sounds normal. No respiratory distress. He has no wheezes. He has no rales. He exhibits no tenderness.  Abdominal: Soft. Bowel sounds are normal. He exhibits no distension and no mass. There is no tenderness. There is no rebound and no guarding.  Musculoskeletal: Normal range of motion. He exhibits no edema or tenderness.  Lymphadenopathy:    He has no cervical adenopathy.  Neurological: He is alert and oriented to person, place, and time. He has normal reflexes. No cranial nerve deficit. He exhibits normal muscle tone. He displays a negative Romberg sign. Coordination and gait normal.  Skin: Skin is warm and dry. No rash noted.  Psychiatric: He has a normal mood and affect. His behavior is normal. Judgment and thought content normal.  Rectal - declined; per Dr Hilarie Fredrickson  Lab Results  Component Value Date   WBC 7.6 01/13/2017   HGB 15.6 01/13/2017   HCT 47.0 01/13/2017   PLT 245.0 01/13/2017   GLUCOSE 107 (H) 01/13/2017   CHOL 201 (H) 01/13/2017   TRIG 160.0 (H) 01/13/2017   HDL 36.50 (L) 01/13/2017   LDLDIRECT 135.4 01/14/2014   LDLCALC 133 (H) 01/13/2017   ALT 27 01/13/2017   AST 20 01/13/2017   NA 144 01/13/2017   K  4.9 01/13/2017   CL 109 01/13/2017   CREATININE 1.20 01/13/2017   BUN 14 01/13/2017   CO2 30 01/13/2017   TSH 1.51 01/13/2017   PSA 0.56 11/16/2015    Dg Chest 2 View  Result Date: 01/14/2014 CLINICAL DATA:  Cough and congestion. EXAM: CHEST  2 VIEW COMPARISON:  01/11/2013. FINDINGS: Mediastinum and hilar structures normal. Left lower lobe infiltrate noted consistent with pneumonia. No pleural effusion or pneumothorax. IMPRESSION: Left  lower lobe infiltrate consistent with pneumonia. Electronically Signed   By: Marcello Moores  Register   On: 01/14/2014 14:35    Assessment & Plan:   There are no diagnoses linked to this encounter.   No orders of the defined types were placed in this encounter.    Follow-up: No follow-ups on file.  Walker Kehr, MD

## 2018-01-15 NOTE — Assessment & Plan Note (Signed)
We discussed age appropriate health related issues, including available/recomended screening tests and vaccinations. We discussed a need for adhering to healthy diet and exercise. Labs were ordered to be later reviewed . All questions were answered. CT ca scoring test offered as an option Colon due in 2019 Dr Hilarie Fredrickson

## 2018-01-15 NOTE — Patient Instructions (Signed)

## 2018-01-16 LAB — PSA, TOTAL AND FREE
PSA, % Free: 33 % (calc) (ref >25)
PSA, Free: 0.2 ng/mL
PSA, Total: 0.6 ng/mL (ref <=4.0)

## 2018-01-25 ENCOUNTER — Ambulatory Visit (AMBULATORY_SURGERY_CENTER): Payer: Self-pay

## 2018-01-25 ENCOUNTER — Telehealth: Payer: Self-pay

## 2018-01-25 ENCOUNTER — Other Ambulatory Visit: Payer: Self-pay

## 2018-01-25 ENCOUNTER — Telehealth: Payer: Self-pay | Admitting: Internal Medicine

## 2018-01-25 VITALS — Ht 72.0 in | Wt 255.0 lb

## 2018-01-25 DIAGNOSIS — Z86010 Personal history of colonic polyps: Secondary | ICD-10-CM

## 2018-01-25 MED ORDER — NA SULFATE-K SULFATE-MG SULF 17.5-3.13-1.6 GM/177ML PO SOLN: 1.0000 | Freq: Once | ORAL | 0 refills | Status: AC

## 2018-01-25 MED ORDER — OMEPRAZOLE 40 MG PO CPDR: 40.0000 mg | DELAYED_RELEASE_CAPSULE | Freq: Every day | ORAL | 3 refills | Status: DC

## 2018-01-25 NOTE — Telephone Encounter (Signed)
Noted will add on egd

## 2018-01-25 NOTE — Telephone Encounter (Signed)
Spoke with [employer]. Suprep $98. Called in $15 off Suprep coupon.

## 2018-01-25 NOTE — Progress Notes (Signed)
No egg or soy allergy known to patient  No issues with past sedation with any surgeries  or procedures, no intubation problems  No diet pills per patient No home 02 use per patient  No blood thinners per patient  Pt denies issues with constipation  No A fib or A flutter  EMMI video sent to pt's e mail , pt declined    

## 2018-01-25 NOTE — Telephone Encounter (Signed)
Pt is scheduled to come in for a Colonoscopy procedure for hx of polyps 3 year recall on !04-12-17 at 2pm. Pt had EGD/Colon done together in 2016. Pt is requesting to have a EGD done this time with his Colon. Pt father had Esophageal cancer.Please advised if we can schedule EGD with colonoscopy. Pt has some complaints of reflux. Dr. Alain Marion has given him a prescription for Omeprazole.

## 2018-01-25 NOTE — Telephone Encounter (Signed)
Okay for EGD for GERD and family history of esophageal cancer and colon for history of polyps

## 2018-01-26 ENCOUNTER — Encounter: Payer: Self-pay | Admitting: Internal Medicine

## 2018-02-09 ENCOUNTER — Ambulatory Visit (AMBULATORY_SURGERY_CENTER): Payer: 59 | Admitting: Internal Medicine

## 2018-02-09 ENCOUNTER — Encounter: Payer: Self-pay | Admitting: Internal Medicine

## 2018-02-09 ENCOUNTER — Telehealth: Payer: Self-pay

## 2018-02-09 VITALS — BP 102/63 | HR 83 | Temp 99.5°F | Resp 12 | Ht 72.0 in | Wt 255.0 lb

## 2018-02-09 DIAGNOSIS — K449 Diaphragmatic hernia without obstruction or gangrene: Secondary | ICD-10-CM | POA: Diagnosis not present

## 2018-02-09 DIAGNOSIS — K219 Gastro-esophageal reflux disease without esophagitis: Secondary | ICD-10-CM | POA: Diagnosis not present

## 2018-02-09 DIAGNOSIS — Z800 Family history of malignant neoplasm of digestive organs: Secondary | ICD-10-CM | POA: Diagnosis not present

## 2018-02-09 DIAGNOSIS — Z86010 Personal history of colonic polyps: Secondary | ICD-10-CM | POA: Diagnosis not present

## 2018-02-09 DIAGNOSIS — K210 Gastro-esophageal reflux disease with esophagitis: Secondary | ICD-10-CM | POA: Diagnosis not present

## 2018-02-09 MED ORDER — SODIUM CHLORIDE 0.9 % IV SOLN: 500.0000 mL | Freq: Once | INTRAVENOUS | Status: DC

## 2018-02-09 NOTE — Telephone Encounter (Signed)
Please see previous message and advise if you are agreeable to this plan of care for this patient;

## 2018-02-09 NOTE — Op Note (Signed)
[facility] Patient Name: Bryan Ward Procedure Date: 02/09/2018 2:08 PM MRN: [id number] Endoscopist: Jerene Bears , MD Age: 52 Referring MD:  Date of Birth: 11/13/1965 Gender: Male Account #: [id number] Procedure:                Colonoscopy Indications:              High risk colon cancer surveillance: Personal                            history of sessile serrated colon polyp (10 mm or                            greater in size), Last colonoscopy 3 years ago Medicines:                Monitored Anesthesia Care Procedure:                Pre-Anesthesia Assessment:                           - Prior to the procedure, a History and Physical                            was performed, and patient medications and                            allergies were reviewed. The patient's tolerance of                            previous anesthesia was also reviewed. The risks                            and benefits of the procedure and the sedation                            options and risks were discussed with the patient.                            All questions were answered, and informed consent                            was obtained. Prior Anticoagulants: The patient has                            taken no previous anticoagulant or antiplatelet                            agents. ASA Grade Assessment: II - A patient with                            mild systemic disease. After reviewing the risks                            and benefits, the patient was deemed in  satisfactory condition to undergo the procedure.                           After obtaining informed consent, the colonoscope                            was passed under direct vision. Throughout the                            procedure, the patient's blood pressure, pulse, and                            oxygen saturations were monitored continuously. The                            Colonoscope was  introduced through the anus and                            advanced to the cecum, identified by appendiceal                            orifice and ileocecal valve. The colonoscopy was                            performed without difficulty. The patient tolerated                            the procedure well. The quality of the bowel                            preparation was good. The ileocecal valve,                            appendiceal orifice, and rectum were photographed. Scope In: 2:21:01 PM Scope Out: 2:36:31 PM Scope Withdrawal Time: 0 hours 11 minutes 41 seconds  Total Procedure Duration: 0 hours 15 minutes 30 seconds  Findings:                 The digital rectal exam was normal.                           The entire examined colon appeared normal on direct                            and retroflexion views. Complications:            No immediate complications. Estimated Blood Loss:     Estimated blood loss: none. Impression:               - The entire examined colon is normal on direct and                            retroflexion views.                           - No specimens  collected. Recommendation:           - Patient has a contact number available for                            emergencies. The signs and symptoms of potential                            delayed complications were discussed with the                            patient. Return to normal activities tomorrow.                            Written discharge instructions were provided to the                            patient.                           - Resume previous diet.                           - Continue present medications.                           - Repeat colonoscopy in 5 years for surveillance. Jerene Bears, MD 02/09/2018 2:46:43 PM This report has been signed electronically.

## 2018-02-09 NOTE — Progress Notes (Signed)
PT taken to PACU. Monitors in place. VSS. Report given to RN. 

## 2018-02-09 NOTE — Telephone Encounter (Signed)
-----   Message from Jerene Bears, MD sent at 02/09/2018  2:59 PM EST ----- Regarding: Cirigliano Appt Pt would like an appt with Dr. Loletha Grayer to discuss TIFThanks JMP

## 2018-02-09 NOTE — Progress Notes (Signed)
Called to room to assist during endoscopic procedure.  Patient ID and intended procedure confirmed with present staff. Received instructions for my participation in the procedure from the performing physician.  

## 2018-02-09 NOTE — Op Note (Signed)
[place] Patient Name: Bryan Ward Procedure Date: 02/09/2018 2:08 PM MRN: [id number] Endoscopist: Jerene Bears , MD Age: 52 Referring MD:  Date of Birth: 1965-04-27 Gender: Male Account #: [id number] Procedure:                Upper GI endoscopy Indications:              Gastro-esophageal reflux disease, Family history of                            esophageal cancer Medicines:                Monitored Anesthesia Care Procedure:                Pre-Anesthesia Assessment:                           - Prior to the procedure, a History and Physical                            was performed, and patient medications and                            allergies were reviewed. The patient's tolerance of                            previous anesthesia was also reviewed. The risks                            and benefits of the procedure and the sedation                            options and risks were discussed with the patient.                            All questions were answered, and informed consent                            was obtained. Prior Anticoagulants: The patient has                            taken no previous anticoagulant or antiplatelet                            agents. ASA Grade Assessment: II - A patient with                            mild systemic disease. After reviewing the risks                            and benefits, the patient was deemed in                            satisfactory condition to undergo the procedure.  After obtaining informed consent, the endoscope was                            passed under direct vision. Throughout the                            procedure, the patient's blood pressure, pulse, and                            oxygen saturations were monitored continuously. The                            Endoscope was introduced through the mouth, and                            advanced to the second part of  duodenum. The upper                            GI endoscopy was accomplished without difficulty.                            The patient tolerated the procedure well. Scope In: Scope Out: Findings:                 LA Grade A (one or more mucosal breaks less than 5                            mm, not extending between tops of 2 mucosal folds)                            esophagitis with no bleeding was found 40 cm from                            the incisors. Biopsies were taken with a cold                            forceps for histology. There was no evidence of                            Barrett's esophagus.                           A 2 cm hiatal hernia was present.                           The entire examined stomach was normal.                           The examined duodenum was normal. Complications:            No immediate complications. Estimated Blood Loss:     Estimated blood loss was minimal. Impression:               - LA Grade A reflux esophagitis. Biopsied.                           -  2 cm hiatal hernia.                           - Normal stomach.                           - Normal examined duodenum. Recommendation:           - Patient has a contact number available for                            emergencies. The signs and symptoms of potential                            delayed complications were discussed with the                            patient. Return to normal activities tomorrow.                            Written discharge instructions were provided to the                            patient.                           - Resume previous diet.                           - Continue present medications including omeprazole                            40 mg once daily (30 minutes before breakfast)                           - Await pathology results. Jerene Bears, MD 02/09/2018 2:44:27 PM This report has been signed electronically.

## 2018-02-09 NOTE — Patient Instructions (Signed)
Per Dr. Hilarie Fredrickson, continue to take Prilosec 40 mg daily, thirty minutes before breakfast.   YOU HAD AN ENDOSCOPIC PROCEDURE TODAY AT [facility]:   Refer to the procedure report that was given to you for any specific questions about what was found during the examination.  If the procedure report does not answer your questions, please call your gastroenterologist to clarify.  If you requested that your care partner not be given the details of your procedure findings, then the procedure report has been included in a sealed envelope for you to review at your convenience later.  YOU SHOULD EXPECT: Some feelings of bloating in the abdomen. Passage of more gas than usual.  Walking can help get rid of the air that was put into your GI tract during the procedure and reduce the bloating. If you had a lower endoscopy (such as a colonoscopy or flexible sigmoidoscopy) you may notice spotting of blood in your stool or on the toilet paper. If you underwent a bowel prep for your procedure, you may not have a normal bowel movement for a few days.  Please Note:  You might notice some irritation and congestion in your nose or some drainage.  This is from the oxygen used during your procedure.  There is no need for concern and it should clear up in a day or so.  SYMPTOMS TO REPORT IMMEDIATELY:   Following lower endoscopy (colonoscopy or flexible sigmoidoscopy):  Excessive amounts of blood in the stool  Significant tenderness or worsening of abdominal pains  Swelling of the abdomen that is new, acute  Fever of 100F or higher   Following upper endoscopy (EGD)  Vomiting of blood or coffee ground material  New chest pain or pain under the shoulder blades  Painful or persistently difficult swallowing  New shortness of breath  Fever of 100F or higher  Black, tarry-looking stools  For urgent or emergent issues, a gastroenterologist can be reached at any hour by calling [phone].   DIET:   We do recommend a small meal at first, but then you may proceed to your regular diet.  Drink plenty of fluids but you should avoid alcoholic beverages for 24 hours.  ACTIVITY:  You should plan to take it easy for the rest of today and you should NOT DRIVE or use heavy machinery until tomorrow (because of the sedation medicines used during the test).    FOLLOW UP: Our staff will call the number listed on your records the next business day following your procedure to check on you and address any questions or concerns that you may have regarding the information given to you following your procedure. If we do not reach you, we will leave a message.  However, if you are feeling well and you are not experiencing any problems, there is no need to return our call.  We will assume that you have returned to your regular daily activities without incident.  If any biopsies were taken you will be contacted by phone or by letter within the next 1-3 weeks.  Please call us at [phone] if you have not heard about the biopsies in 3 weeks.    SIGNATURES/CONFIDENTIALITY: You and/or your care partner have signed paperwork which will be entered into your electronic medical record.  These signatures attest to the fact that that the information above on your After Visit Summary has been reviewed and is understood.  Full responsibility of the confidentiality of this discharge information lies with  you and/or your care-partner.

## 2018-02-12 ENCOUNTER — Telehealth: Payer: Self-pay | Admitting: *Deleted

## 2018-02-12 ENCOUNTER — Telehealth: Payer: Self-pay

## 2018-02-12 NOTE — Telephone Encounter (Signed)
Patient has been scheduled with Dr.Cirigliano on 02/20/18 arrival at 10:30 am for 10:45 am appt for consult concerning TIF procedure;

## 2018-02-12 NOTE — Telephone Encounter (Signed)
Yes, happy to see him in clinic. Thanks.

## 2018-02-12 NOTE — Telephone Encounter (Signed)
  Follow up Call-  Call back number 02/09/2018  Post procedure Call Back phone  # [phone]  Permission to leave phone message Yes  Some recent data might be hidden     Patient questions:  Do you have a fever, pain , or abdominal swelling? No. Pain Score  0 *  Have you tolerated food without any problems? Yes.    Have you been able to return to your normal activities? Yes.    Do you have any questions about your discharge instructions: Diet   No. Medications  No. Follow up visit  No.  Do you have questions or concerns about your Care? No.  Actions: * If pain score is 4 or above: No action needed, pain <4.

## 2018-02-12 NOTE — Telephone Encounter (Signed)
Follow up call made, number identifier left a voicemail.

## 2018-02-20 ENCOUNTER — Encounter: Payer: Self-pay | Admitting: Internal Medicine

## 2018-02-20 ENCOUNTER — Ambulatory Visit: Payer: 59 | Admitting: Gastroenterology

## 2018-02-26 ENCOUNTER — Telehealth: Payer: Self-pay

## 2018-02-26 NOTE — Telephone Encounter (Signed)
Called and spoke with patient (using interpreter services rep [phone])- at this time, the patient reports his insurance will not cover the surgery and as soon as he works out how to pay for the procedure he will contact the office to schedule the appt; Patient was advised to call back if questions/concerns arise;

## 2018-02-26 NOTE — Telephone Encounter (Signed)
Ok, thank you for the update.

## 2018-02-26 NOTE — Telephone Encounter (Signed)
-----   Message from [place], DO sent at 02/26/2018  8:32 AM EST ----- Regarding: RE: Cirigliano Appt This patient was scheduled for appt with me last week to discuss TIF. Not sure if there was a reason or he just wanted to instead continue with meds only. Mind calling to see if he is still interested in an appointment? Thanks! ----- Message ----- From: Mohammed Kindle, RN Sent: 02/12/2018   8:41 AM EST To: Lavena Bullion, DO Subject: FW: Cirigliano Appt                            Would you like for me to schedule this appt now or after you review the patient's chart and approve of the referral to you? Please advise ----- Message ----- From: Jerene Bears, MD Sent: 02/09/2018   2:59 PM EST To: Mohammed Kindle, RN Subject: Cirigliano Appt                                Pt would like an appt with Dr. Loletha Grayer to discuss TIFThanks JMP

## 2018-09-04 ENCOUNTER — Telehealth: Payer: Self-pay | Admitting: *Deleted

## 2018-09-04 MED ORDER — MELOXICAM 15 MG PO TABS: 15.0000 mg | ORAL_TABLET | Freq: Every day | ORAL | 1 refills | Status: DC | PRN

## 2018-09-04 NOTE — Telephone Encounter (Signed)
Copied from [place] [id number]. Topic: General - Other >> Sep 04, 2018 11:02 AM Keene Breath wrote: Reason for CRM: Patient called to request some medication for his pain.  He stated that he has taken meloxicam.  Please advise and call patient back at [phone]

## 2018-09-04 NOTE — Telephone Encounter (Signed)
Okay.  Meloxicam prescription was emailed.  Thank you

## 2018-09-05 NOTE — Telephone Encounter (Signed)
Pt informed of below.  

## 2019-03-04 ENCOUNTER — Ambulatory Visit (INDEPENDENT_AMBULATORY_CARE_PROVIDER_SITE_OTHER)
Admission: RE | Admit: 2019-03-04 | Discharge: 2019-03-04 | Disposition: A | Discharge status: Home / Self Care | Payer: 59 | Source: Ambulatory Visit

## 2019-03-04 DIAGNOSIS — M791 Myalgia, unspecified site: Secondary | ICD-10-CM

## 2019-03-04 DIAGNOSIS — R5383 Other fatigue: Secondary | ICD-10-CM

## 2019-03-04 DIAGNOSIS — B349 Viral infection, unspecified: Secondary | ICD-10-CM

## 2019-03-04 DIAGNOSIS — R519 Headache, unspecified: Secondary | ICD-10-CM | POA: Diagnosis not present

## 2019-03-04 DIAGNOSIS — R509 Fever, unspecified: Secondary | ICD-10-CM

## 2019-03-04 NOTE — Discharge Instructions (Addendum)
Come in to the Urgent Care to have a COVID test.  You should self quarantine until your test result is back and is negative.    Take Tylenol as needed for fever or discomfort.  Rest and keep yourself hydrated with 8-10 glasses of water each day.    Go to the emergency department if you develop high fever, shortness of breath, severe diarrhea, or other concerning symptoms.

## 2019-03-04 NOTE — ED Provider Notes (Signed)
Virtual Visit via Video Note:  Bryan Ward  initiated request for Telemedicine visit with [facility]gent [facility] team. I connected with Bryan Ward  on 03/04/2019 at 10:52 AM  for a synchronized telemedicine visit using a video enabled HIPPA compliant telemedicine application. I verified that I am speaking with Bryan Ward  using two identifiers. Sharion Balloon, NP  was physically located in a [facility] Urgent care site and Bryan Ward was located at a different location.   The limitations of evaluation and management by telemedicine as well as the availability of in-person appointments were discussed. Patient was informed that he  may incur a bill ( including co-pay) for this virtual visit encounter. Bryan Ward  expressed understanding and gave verbal consent to proceed with virtual visit.     History of Present Illness:Bryan Ward  is a 54 y.o. male presents for evaluation of "flu symptoms" x 5 days.  He reports fatigue, chills, body aches, fever, rhinorrhea, and headache. Tmax 100.4.  He has attempted treatment with OTC cold medication and salt water gargles.  He denies cough, shortness of breath, vomiting, diarrhea, rash, or other symptoms.  He states he has a COVID scheduled tomorrow.     No Known Allergies   Past Medical History:  Diagnosis Date  . Helicobacter pylori antibody positive      Social History   Tobacco Use  . Smoking status: Former Smoker    Packs/day: 0.25    Types: Cigarettes    Quit date: 10/15/2013    Years since quitting: 5.3  . Smokeless tobacco: Never Used  Substance Use Topics  . Alcohol use: Yes    Alcohol/week: 0.0 standard drinks    Comment: Occassionally  . Drug use: No        Observations/Objective: Physical Exam  VITALS: Patient reports intermittent fever; none today. GENERAL: Alert, appears well and in no acute distress. HEENT: Atraumatic. NECK: Normal movements of the head and  neck. CARDIOPULMONARY: No increased WOB. Speaking in clear sentences. I:E ratio WNL.  MS: Moves all visible extremities without noticeable abnormality. PSYCH: Pleasant and cooperative, well-groomed. Speech normal rate and rhythm. Affect is appropriate. Insight and judgement are appropriate. Attention is focused, linear, and appropriate.  NEURO: CN grossly intact. Oriented as arrived to appointment on time with no prompting. Moves both UE equally.  SKIN: No obvious lesions, wounds, erythema, or cyanosis noted on face or hands.   Assessment and Plan:    ICD-10-CM   1. Viral illness  B34.9        Follow Up Instructions: Symptomatic treatment with Tylenol as needed, rest, hydration.  Instructed patient to come into the urgent care for a COVID test today or keep his scheduled appointment for a COVID test tomorrow.  Instructed him to self quarantine until the test results are back.  Discussed with patient that he should go to the emergency department if he has high fever, shortness of breath, severe diarrhea, or other concerning symptoms.  Patient agrees to plan of care.    I discussed the assessment and treatment plan with the patient. The patient was provided an opportunity to ask questions and all were answered. The patient agreed with the plan and demonstrated an understanding of the instructions.   The patient was advised to call back or seek an in-person evaluation if the symptoms worsen or if the condition fails to improve as anticipated.      Sharion Balloon, NP  03/04/2019  10:52 AM         Sharion Balloon, NP 03/04/19 1055

## 2019-03-05 ENCOUNTER — Ambulatory Visit: Payer: 59 | Attending: Internal Medicine

## 2019-03-05 DIAGNOSIS — Z20822 Contact with and (suspected) exposure to covid-19: Secondary | ICD-10-CM

## 2019-03-06 LAB — NOVEL CORONAVIRUS, NAA: SARS-CoV-2, NAA: NOT DETECTED

## 2019-07-17 ENCOUNTER — Ambulatory Visit: Payer: 59 | Attending: Internal Medicine

## 2019-07-17 DIAGNOSIS — Z20822 Contact with and (suspected) exposure to covid-19: Secondary | ICD-10-CM

## 2019-07-18 LAB — SARS-COV-2, NAA 2 DAY TAT

## 2019-07-18 LAB — NOVEL CORONAVIRUS, NAA: SARS-CoV-2, NAA: NOT DETECTED

## 2020-11-02 ENCOUNTER — Encounter (HOSPITAL_COMMUNITY): Payer: Self-pay | Admitting: Emergency Medicine

## 2020-11-02 ENCOUNTER — Emergency Department (HOSPITAL_COMMUNITY)
Admission: EM | Admit: 2020-11-02 | Discharge: 2020-11-03 | Disposition: A | Discharge status: Home / Self Care | Payer: 59 | Attending: Emergency Medicine | Admitting: Emergency Medicine

## 2020-11-02 ENCOUNTER — Other Ambulatory Visit: Payer: Self-pay

## 2020-11-02 ENCOUNTER — Telehealth: Payer: Self-pay | Admitting: Internal Medicine

## 2020-11-02 ENCOUNTER — Emergency Department (HOSPITAL_COMMUNITY): Payer: 59

## 2020-11-02 DIAGNOSIS — U071 COVID-19: Secondary | ICD-10-CM | POA: Diagnosis not present

## 2020-11-02 DIAGNOSIS — Z87891 Personal history of nicotine dependence: Secondary | ICD-10-CM | POA: Diagnosis not present

## 2020-11-02 DIAGNOSIS — R509 Fever, unspecified: Secondary | ICD-10-CM | POA: Diagnosis present

## 2020-11-02 DIAGNOSIS — R590 Localized enlarged lymph nodes: Secondary | ICD-10-CM | POA: Diagnosis not present

## 2020-11-02 LAB — RESP PANEL BY RT-PCR (FLU A&B, COVID) ARPGX2
Influenza A by PCR: NEGATIVE
Influenza B by PCR: NEGATIVE
SARS Coronavirus 2 by RT PCR: POSITIVE — AB

## 2020-11-02 LAB — CBC WITH DIFFERENTIAL/PLATELET
Abs Immature Granulocytes: 0.07 10*3/uL (ref 0.00–0.07)
Basophils Absolute: 0 10*3/uL (ref 0.0–0.1)
Basophils Relative: 0 %
Eosinophils Absolute: 0 10*3/uL (ref 0.0–0.5)
Eosinophils Relative: 0 %
HCT: 53.6 % — ABNORMAL HIGH (ref 39.0–52.0)
Hemoglobin: 17.4 g/dL — ABNORMAL HIGH (ref 13.0–17.0)
Immature Granulocytes: 1 %
Lymphocytes Relative: 11 %
Lymphs Abs: 1.6 10*3/uL (ref 0.7–4.0)
MCH: 28.8 pg (ref 26.0–34.0)
MCHC: 32.5 g/dL (ref 30.0–36.0)
MCV: 88.7 fL (ref 80.0–100.0)
Monocytes Absolute: 1.8 10*3/uL — ABNORMAL HIGH (ref 0.1–1.0)
Monocytes Relative: 12 %
Neutro Abs: 11.9 10*3/uL — ABNORMAL HIGH (ref 1.7–7.7)
Neutrophils Relative %: 76 %
Platelets: 231 10*3/uL (ref 150–400)
RBC: 6.04 MIL/uL — ABNORMAL HIGH (ref 4.22–5.81)
RDW: 13.5 % (ref 11.5–15.5)
WBC: 15.4 10*3/uL — ABNORMAL HIGH (ref 4.0–10.5)
nRBC: 0 % (ref 0.0–0.2)

## 2020-11-02 LAB — COMPREHENSIVE METABOLIC PANEL
ALT: 38 U/L (ref 0–44)
AST: 32 U/L (ref 15–41)
Albumin: 5 g/dL (ref 3.5–5.0)
Alkaline Phosphatase: 70 U/L (ref 38–126)
Anion gap: 14 (ref 5–15)
BUN: 15 mg/dL (ref 6–20)
CO2: 24 mmol/L (ref 22–32)
Calcium: 9.9 mg/dL (ref 8.9–10.3)
Chloride: 99 mmol/L (ref 98–111)
Creatinine, Ser: 1.4 mg/dL — ABNORMAL HIGH (ref 0.61–1.24)
GFR, Estimated: 59 mL/min — ABNORMAL LOW (ref >60)
Glucose, Bld: 140 mg/dL — ABNORMAL HIGH (ref 70–99)
Potassium: 4 mmol/L (ref 3.5–5.1)
Sodium: 137 mmol/L (ref 135–145)
Total Bilirubin: 1 mg/dL (ref 0.3–1.2)
Total Protein: 9.3 g/dL — ABNORMAL HIGH (ref 6.5–8.1)

## 2020-11-02 MED ORDER — ALBUTEROL SULFATE HFA 108 (90 BASE) MCG/ACT IN AERS
1.0000 | INHALATION_SPRAY | Freq: Once | RESPIRATORY_TRACT | Status: AC
  Administered 2020-11-02: 1 via RESPIRATORY_TRACT
  Filled 2020-11-02: qty 6.7

## 2020-11-02 MED ORDER — SODIUM CHLORIDE 0.9 % IV BOLUS
1000.0000 mL | Freq: Once | INTRAVENOUS | Status: AC
Start: 2020-11-02 — End: 2020-11-03
  Administered 2020-11-02: 1000 mL via INTRAVENOUS

## 2020-11-02 MED ORDER — ALUM & MAG HYDROXIDE-SIMETH 200-200-20 MG/5ML PO SUSP
30.0000 mL | Freq: Once | ORAL | Status: AC
  Administered 2020-11-02: 30 mL via ORAL
  Filled 2020-11-02: qty 30

## 2020-11-02 MED ORDER — LIDOCAINE VISCOUS HCL 2 % MT SOLN
15.0000 mL | Freq: Once | OROMUCOSAL | Status: AC
  Administered 2020-11-02: 15 mL via ORAL
  Filled 2020-11-02: qty 15

## 2020-11-02 MED ORDER — NIRMATRELVIR/RITONAVIR (PAXLOVID)TABLET: 3.0000 | ORAL_TABLET | Freq: Two times a day (BID) | ORAL | 0 refills | Status: AC

## 2020-11-02 MED ORDER — ACETAMINOPHEN 325 MG PO TABS
650.0000 mg | ORAL_TABLET | Freq: Once | ORAL | Status: AC
  Administered 2020-11-02: 650 mg via ORAL
  Filled 2020-11-02: qty 2

## 2020-11-02 MED ORDER — LEVOFLOXACIN IN D5W 500 MG/100ML IV SOLN
500.0000 mg | Freq: Once | INTRAVENOUS | Status: AC
  Administered 2020-11-02: 500 mg via INTRAVENOUS
  Filled 2020-11-02: qty 100

## 2020-11-02 MED ORDER — LEVOFLOXACIN 500 MG PO TABS: 500.0000 mg | ORAL_TABLET | Freq: Every day | ORAL | 0 refills | Status: DC

## 2020-11-02 NOTE — ED Provider Notes (Addendum)
[facility] DEPT Provider Note   CSN: [id number] Arrival date & time: 11/02/20  1749     History Chief Complaint  Patient presents with   Fever   Covid Positive   Dysphagia    Bryan Ward is a 55 y.o. male.  Past medical history of GERD, community-acquired pneumonia, H pylori Patient presents with 3 days of sore throat and difficulty swallowing.  Cough started gradually and has gradually worsened. He tested positive for COVID two days ago. He says that he has profuse mucus production that is brown.  Been taking Robitussin with no improvement.  He has also had fevers and body aches.  Some chest tightness at times but is not present currently.  Denies any difficulty breathing, abdominal pain, nausea, vomiting.   Fever Associated symptoms: cough, myalgias and sore throat   Associated symptoms: no chest pain, no chills, no congestion, no diarrhea, no headaches, no nausea, no rash, no rhinorrhea and no vomiting       Past Medical History:  Diagnosis Date   Helicobacter pylori antibody positive     Patient Active Problem List   Diagnosis Date Noted   Left lateral epicondylitis 02/01/2017   Neoplasm of uncertain behavior of skin 01/13/2017   Bell's palsy 06/15/2016   History of colonic polyps 11/16/2015   Elbow pain 11/16/2015   GERD (gastroesophageal reflux disease) 01/14/2014   Cough 01/14/2014   H. pylori infection 01/14/2014   CAP (community acquired pneumonia) 01/14/2014   Well adult exam 01/11/2013   Pain of left thumb 01/11/2013   JOINT EFFUSION, LEFT KNEE 08/21/2007   KNEE PAIN 08/21/2007    Past Surgical History:  Procedure Laterality Date   COLONOSCOPY     KNEE ARTHROSCOPY W/ LASER Left 2002   Dr Noemi Chapel   POLYPECTOMY         Family History  Problem Relation Age of Onset   Arthritis Father    Esophageal cancer Father 63   Liver cancer Sister    Colon cancer Neg Hx    Colon polyps Neg Hx    Diabetes Neg Hx     Kidney disease Neg Hx    Heart disease Neg Hx     Social History   Tobacco Use   Smoking status: Former    Packs/day: 0.25    Types: Cigarettes    Quit date: 10/15/2013    Years since quitting: 7.0   Smokeless tobacco: Never  Substance Use Topics   Alcohol use: Yes    Alcohol/week: 0.0 standard drinks    Comment: Occassionally   Drug use: No    Home Medications Prior to Admission medications   Medication Sig Start Date End Date Taking? Authorizing Provider  levofloxacin (LEVAQUIN) 500 MG tablet Take 1 tablet (500 mg total) by mouth daily. 11/02/20  Yes Adarryl Goldammer, Adora Fridge, PA-C  nirmatrelvir/ritonavir EUA (PAXLOVID) 20 x 150 MG & 10 x '100MG'$  TABS Take 3 tablets by mouth 2 (two) times daily for 5 days. Patient GFR is 59. Take nirmatrelvir (150 mg) two tablets twice daily for 5 days and ritonavir (100 mg) one tablet twice daily for 5 days. 11/02/20 11/07/20 Yes Dennis Hegeman, Adora Fridge, PA-C  b complex vitamins tablet Take 1 tablet by mouth daily. Patient not taking: Reported on 02/09/2018 06/15/16   Plotnikov, Evie Lacks, MD  Cholecalciferol (VITAMIN D3) 2000 units capsule Take by mouth.    [provider]  meloxicam (MOBIC) 15 MG tablet Take 1 tablet (15 mg total) by mouth  daily as needed for pain. With food 09/04/18   Plotnikov, Evie Lacks, MD  omeprazole (PRILOSEC) 40 MG capsule Take 1 capsule (40 mg total) by mouth daily. 01/25/18   Plotnikov, Evie Lacks, MD    Allergies    Patient has no known allergies.  Review of Systems   Review of Systems  Constitutional:  Positive for fever. Negative for chills.  HENT:  Positive for sore throat and trouble swallowing. Negative for congestion and rhinorrhea.   Eyes:  Negative for visual disturbance.  Respiratory:  Positive for cough and chest tightness. Negative for shortness of breath.   Cardiovascular:  Negative for chest pain, palpitations and leg swelling.  Gastrointestinal:  Negative for abdominal pain, constipation, diarrhea, nausea and  vomiting.  Genitourinary:  Negative for difficulty urinating.  Musculoskeletal:  Positive for myalgias. Negative for back pain.  Skin:  Negative for rash and wound.  Neurological:  Negative for dizziness, syncope, weakness, light-headedness and headaches.  All other systems reviewed and are negative.  Physical Exam Updated Vital Signs BP (!) 167/102   Pulse 88   Temp (!) 103 F (39.4 C) (Oral)   Resp 19   Ht 6' (1.829 m)   Wt 113.9 kg   SpO2 99%   BMI 34.04 kg/m   Physical Exam Vitals and nursing note reviewed.  Constitutional:      General: He is not in acute distress.    Appearance: Normal appearance. He is obese. He is not ill-appearing, toxic-appearing or diaphoretic.  HENT:     Head: Normocephalic and atraumatic.     Right Ear: Tympanic membrane normal.     Left Ear: Tympanic membrane normal.     Nose: Nose normal.     Mouth/Throat:     Mouth: Mucous membranes are moist. No oral lesions.     Pharynx: Pharyngeal swelling and posterior oropharyngeal erythema present. No oropharyngeal exudate or uvula swelling.     Tonsils: No tonsillar exudate or tonsillar abscesses.  Eyes:     General: No scleral icterus.       Right eye: No discharge.        Left eye: No discharge.     Conjunctiva/sclera: Conjunctivae normal.  Cardiovascular:     Rate and Rhythm: Normal rate and regular rhythm.     Pulses: Normal pulses.     Heart sounds: Normal heart sounds, S1 normal and S2 normal. No murmur heard.   No friction rub. No gallop.  Pulmonary:     Effort: Pulmonary effort is normal. No respiratory distress.     Breath sounds: No stridor. Rhonchi and rales present. No wheezing.  Abdominal:     General: Abdomen is flat. Bowel sounds are normal. There is no distension.     Palpations: Abdomen is soft. There is no pulsatile mass.     Tenderness: There is no abdominal tenderness. There is no guarding or rebound.  Musculoskeletal:     Right lower leg: No edema.     Left lower leg: No  edema.  Lymphadenopathy:     Cervical: Cervical adenopathy present.     Left cervical: Superficial cervical adenopathy present.  Skin:    General: Skin is warm and dry.     Coloration: Skin is not jaundiced.     Findings: No bruising, erythema, lesion or rash.  Neurological:     General: No focal deficit present.     Mental Status: He is alert and oriented to person, place, and time.  Psychiatric:  Mood and Affect: Mood normal.        Behavior: Behavior normal.    ED Results / Procedures / Treatments   Labs (all labs ordered are listed, but only abnormal results are displayed) Labs Reviewed  RESP PANEL BY RT-PCR (FLU A&B, COVID) ARPGX2 - Abnormal; Notable for the following components:      Result Value   SARS Coronavirus 2 by RT PCR POSITIVE (*)    All other components within normal limits  CBC WITH DIFFERENTIAL/PLATELET - Abnormal; Notable for the following components:   WBC 15.4 (*)    RBC 6.04 (*)    Hemoglobin 17.4 (*)    HCT 53.6 (*)    Neutro Abs 11.9 (*)    Monocytes Absolute 1.8 (*)    All other components within normal limits  COMPREHENSIVE METABOLIC PANEL - Abnormal; Notable for the following components:   Glucose, Bld 140 (*)    Creatinine, Ser 1.40 (*)    Total Protein 9.3 (*)    GFR, Estimated 59 (*)    All other components within normal limits    EKG None  Radiology DG Chest 2 View  Result Date: 11/02/2020 CLINICAL DATA:  Coarse lung sounds. Technologist notes state COVID. Fever and body aches. EXAM: CHEST - 2 VIEW COMPARISON:  01/14/2014 FINDINGS: The cardiomediastinal contours are normal. Minimal subsegmental opacities at the right lung base. Pulmonary vasculature is normal. No consolidation, pleural effusion, or pneumothorax. No acute osseous abnormalities are seen. IMPRESSION: Minimal subsegmental opacities at the right lung base, favor atelectasis. Electronically Signed   By: Keith Rake M.D.   On: 11/02/2020 21:08     Procedures Procedures   Medications Ordered in ED Medications  levofloxacin (LEVAQUIN) IVPB 500 mg (500 mg Intravenous New Bag/Given 11/02/20 2341)  acetaminophen (TYLENOL) tablet 650 mg (650 mg Oral Given 11/02/20 2138)  sodium chloride 0.9 % bolus 1,000 mL (1,000 mLs Intravenous New Bag/Given 11/02/20 2139)  alum & mag hydroxide-simeth (MAALOX/MYLANTA) 200-200-20 MG/5ML suspension 30 mL (30 mLs Oral Given 11/02/20 2138)    And  lidocaine (XYLOCAINE) 2 % viscous mouth solution 15 mL (15 mLs Oral Given 11/02/20 2138)  albuterol (VENTOLIN HFA) 108 (90 Base) MCG/ACT inhaler 1 puff (1 puff Inhalation Given 11/02/20 2138)  lidocaine (XYLOCAINE) 2 % viscous mouth solution 15 mL (15 mLs Oral Given 11/02/20 2338)    ED Course  I have reviewed the triage vital signs and the nursing notes.  Pertinent labs & imaging results that were available during my care of the patient were reviewed by me and considered in my medical decision making (see chart for details).    MDM Rules/Calculators/A&P                         Is a well-appearing 55 year old male presents the emergency department with upper respiratory symptoms and a recent COVID diagnosis 2 days ago.  He initially presented with tachycardia to the 130s and fever to 103.  He was oxygenating well on room air. On exam he had some coarse lung sounds.  He appears to be in no acute distress.  He is mostly concerned about his sore throat.  Reviewed all labs and imaging.  He did have an elevated white count to 15.4.  This is not usually typical of COVID so he may have an underlying bacterial pneumonia going on given that his chest x-ray showed some atelectasis.  Otherwise his labs did show a creatinine of 1.4 however his most  recent baseline was over 2 years ago.  He tested positive for COVID.   Treated his fever with Tylenol gave him 1 L IV fluids with hopes to improve his heart rate.  Sore throat was treated with lidocaine mouth solution along with  Maalox.  He was also given 1 puff of Ventolin inhaler to help with breathing.  Given that he had an elevated white blood cell count we will go ahead and start him on Levaquin.    On reevaluation, patient's heart rate had gone down to 88 and sustained at that level. Lungs clear.  Initial tachycardia was likely related to hypovolemia and fever.  I feel comfortable discharging this patient home.  We will send Paxlovid and course of Cipro.  I discussed these findings and plan with the patient and they stable for discharge.  If he develops worsening symptoms such as difficulty breathing or chest pain he should return to the emergency department.  Discussed this case extensively with Dr. Darl Householder who has seen this patient and agrees with the plan set forth.  Final Clinical Impression(s) / ED Diagnoses Final diagnoses:  COVID-19    Rx / DC Orders ED Discharge Orders          Ordered    levofloxacin (LEVAQUIN) 500 MG tablet  Daily        11/02/20 2237    nirmatrelvir/ritonavir EUA (PAXLOVID) 20 x 150 MG & 10 x '100MG'$  TABS  2 times daily        11/02/20 2325             Adolphus Birchwood, PA-C 11/03/20 0017    Adolphus Birchwood, PA-C 11/03/20 0018    Drenda Freeze, MD 11/05/20 1349

## 2020-11-02 NOTE — Discharge Instructions (Addendum)
I have prescribed you a 5 day course of antibiotics (Levaquin) I have also prescribed you Paxlovid which will help you recover from COVID-19 Please pick these medications up from the pharmacy.   You have tested positive for COVID-19. Please stay home for at least 5 days from symptom onset. Isolate from others in your home. You are likely most infectious during these first 5 days.  You may end isolation after day 5 if you are fever free for 24 hours AND your symptoms are improving. Please continue to wear a mask in public until day 10.   If you have?moderate illness?(if you experienced shortness of breath or had difficulty breathing), or?severe illness?(you were hospitalized) due to COVID-19, or you have a weakened immune system, you need to isolate through day 10.  Please return if you develop difficulty breathing, chest pain, or worsening symptoms after day 5, please return to the emergency department.

## 2020-11-02 NOTE — ED Triage Notes (Signed)
Pt came from home via POV. Pt states he tested positive for covid on 10/31/20. C/c: throat pain x 3 days, pt reports difficulty swallowing, chest pain, diarrhea, severe throat pain, profuse mucus production. Pt reports temp of 103 at home. Pt reports mucus is brown in coloration, pt states the color could be from drinking robitussin.

## 2020-11-02 NOTE — Telephone Encounter (Unsigned)
Patient's wife states he tested positive for COVID on Sunday. Sx sore throat and chest pain since Friday. Requesting an order to assist with the sx. Offered an virtual visit but the wife declined. Preferred to ask the PCP first.

## 2020-11-25 ENCOUNTER — Ambulatory Visit (INDEPENDENT_AMBULATORY_CARE_PROVIDER_SITE_OTHER): Payer: 59 | Admitting: Internal Medicine

## 2020-11-25 ENCOUNTER — Other Ambulatory Visit: Payer: Self-pay

## 2020-11-25 ENCOUNTER — Encounter: Payer: Self-pay | Admitting: Internal Medicine

## 2020-11-25 DIAGNOSIS — R944 Abnormal results of kidney function studies: Secondary | ICD-10-CM | POA: Insufficient documentation

## 2020-11-25 DIAGNOSIS — R7989 Other specified abnormal findings of blood chemistry: Secondary | ICD-10-CM | POA: Diagnosis not present

## 2020-11-25 DIAGNOSIS — U071 COVID-19: Secondary | ICD-10-CM | POA: Diagnosis not present

## 2020-11-25 DIAGNOSIS — R5383 Other fatigue: Secondary | ICD-10-CM

## 2020-11-25 DIAGNOSIS — R7309 Other abnormal glucose: Secondary | ICD-10-CM | POA: Diagnosis not present

## 2020-11-25 DIAGNOSIS — Z8616 Personal history of COVID-19: Secondary | ICD-10-CM | POA: Diagnosis not present

## 2020-11-25 LAB — CBC WITH DIFFERENTIAL/PLATELET
Basophils Absolute: 0.1 10*3/uL (ref 0.0–0.1)
Basophils Relative: 0.8 % (ref 0.0–3.0)
Eosinophils Absolute: 0.1 10*3/uL (ref 0.0–0.7)
Eosinophils Relative: 1.8 % (ref 0.0–5.0)
HCT: 42.3 % (ref 39.0–52.0)
Hemoglobin: 13.9 g/dL (ref 13.0–17.0)
Lymphocytes Relative: 30.7 % (ref 12.0–46.0)
Lymphs Abs: 2.2 10*3/uL (ref 0.7–4.0)
MCHC: 32.9 g/dL (ref 30.0–36.0)
MCV: 87.5 fl (ref 78.0–100.0)
Monocytes Absolute: 0.7 10*3/uL (ref 0.1–1.0)
Monocytes Relative: 10.5 % (ref 3.0–12.0)
Neutro Abs: 4 10*3/uL (ref 1.4–7.7)
Neutrophils Relative %: 56.2 % (ref 43.0–77.0)
Platelets: 248 10*3/uL (ref 150.0–400.0)
RBC: 4.83 Mil/uL (ref 4.22–5.81)
RDW: 14.5 % (ref 11.5–15.5)
WBC: 7.1 10*3/uL (ref 4.0–10.5)

## 2020-11-25 LAB — COMPREHENSIVE METABOLIC PANEL
ALT: 27 U/L (ref 0–53)
AST: 19 U/L (ref 0–37)
Albumin: 4.2 g/dL (ref 3.5–5.2)
Alkaline Phosphatase: 68 U/L (ref 39–117)
BUN: 12 mg/dL (ref 6–23)
CO2: 28 mEq/L (ref 19–32)
Calcium: 9.3 mg/dL (ref 8.4–10.5)
Chloride: 105 mEq/L (ref 96–112)
Creatinine, Ser: 1.14 mg/dL (ref 0.40–1.50)
GFR: 71.41 mL/min (ref >60.00)
Glucose, Bld: 103 mg/dL — ABNORMAL HIGH (ref 70–99)
Potassium: 4.2 mEq/L (ref 3.5–5.1)
Sodium: 140 mEq/L (ref 135–145)
Total Bilirubin: 0.7 mg/dL (ref 0.2–1.2)
Total Protein: 7.3 g/dL (ref 6.0–8.3)

## 2020-11-25 LAB — HEMOGLOBIN A1C: Hgb A1c MFr Bld: 6.3 % (ref 4.6–6.5)

## 2020-11-25 NOTE — Assessment & Plan Note (Signed)
Post-Covid: SOB, cough, fatigue Pt declined w/up, meds Will watch

## 2020-11-25 NOTE — Assessment & Plan Note (Signed)
due to ?dehydration during COVID Check CMET

## 2020-11-25 NOTE — Progress Notes (Signed)
Subjective:  Patient ID: Bryan Ward, male    DOB: [birth year]  Age: 55 y.o. MRN: [id number]  CC: Follow-up [facility] f/u)   HPI Bryan Ward presents for COVID19 in Sept 2022. He went to the emergency department "with upper respiratory symptoms and a recent COVID diagnosis 2 days ago.  He initially presented with tachycardia to the 130s and fever to 103.  He was oxygenating well on room air. On exam he had some coarse lung sounds.  He appears to be in no acute distress.  He is mostly concerned about his sore throat.   Reviewed all labs and imaging.  He did have an elevated white count to 15.4.  This is not usually typical of COVID so he may have an underlying bacterial pneumonia going on given that his chest x-ray showed some atelectasis.  Otherwise his labs did show a creatinine of 1.4 however his most recent baseline was over 2 years ago.  He tested positive for COVID.    Treated his fever with Tylenol gave him 1 L IV fluids with hopes to improve his heart rate.  Sore throat was treated with lidocaine mouth solution along with Maalox.  He was also given 1 puff of Ventolin inhaler to help with breathing.  Given that he had an elevated white blood cell count we will go ahead and start him on Levaquin." He took Paxlovid.  C/o SOB, cough, fatigue  Outpatient Medications Prior to Visit  Medication Sig Dispense Refill   Cholecalciferol (VITAMIN D3) 2000 units capsule Take by mouth.     b complex vitamins tablet Take 1 tablet by mouth daily. (Patient not taking: Reported on 11/25/2020) 100 tablet 1   levofloxacin (LEVAQUIN) 500 MG tablet Take 1 tablet (500 mg total) by mouth daily. (Patient not taking: Reported on 11/25/2020) 7 tablet 0   meloxicam (MOBIC) 15 MG tablet Take 1 tablet (15 mg total) by mouth daily as needed for pain. With food (Patient not taking: Reported on 11/25/2020) 30 tablet 1   omeprazole (PRILOSEC) 40 MG capsule Take 1 capsule (40 mg total) by mouth daily. (Patient  not taking: Reported on 11/25/2020) 90 capsule 3   No facility-administered medications prior to visit.    ROS: Review of Systems  Objective:  BP 120/80 (BP Location: Left Arm)   Pulse 75   Temp 98.3 F (36.8 C) (Oral)   Ht 6' (1.829 m)   Wt 252 lb 12.8 oz (114.7 kg)   SpO2 95%   BMI 34.29 kg/m   BP Readings from Last 3 Encounters:  11/25/20 120/80  11/03/20 (!) 174/100  02/09/18 102/63    Wt Readings from Last 3 Encounters:  11/25/20 252 lb 12.8 oz (114.7 kg)  11/02/20 251 lb (113.9 kg)  02/09/18 255 lb (115.7 kg)    Physical Exam  Lab Results  Component Value Date   WBC 15.4 (H) 11/02/2020   HGB 17.4 (H) 11/02/2020   HCT 53.6 (H) 11/02/2020   PLT 231 11/02/2020   GLUCOSE 140 (H) 11/02/2020   CHOL 196 01/15/2018   TRIG 208.0 (H) 01/15/2018   HDL 37.50 (L) 01/15/2018   LDLDIRECT 135.0 01/15/2018   LDLCALC 133 (H) 01/13/2017   ALT 38 11/02/2020   AST 32 11/02/2020   NA 137 11/02/2020   K 4.0 11/02/2020   CL 99 11/02/2020   CREATININE 1.40 (H) 11/02/2020   BUN 15 11/02/2020   CO2 24 11/02/2020   TSH 1.43 01/15/2018   PSA 0.57 01/15/2018  DG Chest 2 View  Result Date: 11/02/2020 CLINICAL DATA:  Coarse lung sounds. Technologist notes state COVID. Fever and body aches. EXAM: CHEST - 2 VIEW COMPARISON:  01/14/2014 FINDINGS: The cardiomediastinal contours are normal. Minimal subsegmental opacities at the right lung base. Pulmonary vasculature is normal. No consolidation, pleural effusion, or pneumothorax. No acute osseous abnormalities are seen. IMPRESSION: Minimal subsegmental opacities at the right lung base, favor atelectasis. Electronically Signed   By: Keith Rake M.D.   On: 11/02/2020 21:08    Assessment & Plan:   Problem List Items Addressed This Visit     Abnormal CBC    due to ?CAP during [place]. Repeat CBC      Relevant Orders   CBC with Differential/Platelet   Decreased GFR    due to ?dehydration during COVID Check CMET       Relevant Orders   Comprehensive metabolic panel   Elevated glucose    Mild - check A1c      Relevant Orders   CBC with Differential/Platelet   Hemoglobin A1c   Fatigue     Post-Covid: SOB, cough, fatigue Pt declined w/up, meds Will watch      History of COVID-19     Pt took Levaquin, Paxlovid.  SOB, cough, fatigue         Follow-up: Return in about 3 months (around 02/25/2021) for a follow-up visit.  Walker Kehr, MD

## 2020-11-25 NOTE — Assessment & Plan Note (Signed)
due to ?CAP during [place]. Repeat CBC

## 2020-11-25 NOTE — Assessment & Plan Note (Signed)
Pt took Levaquin, [place].  SOB, cough, fatigue

## 2020-11-25 NOTE — Progress Notes (Signed)
Subjective:  Patient ID: Bryan Ward, male    DOB: [birth year]  Age: 55 y.o. MRN: [id number]  CC: Follow-up ([facility]osp f/u)   HPI ALIM CATTELL presents for a post-Covid: SOB, cough, fatigue. He had COVID in 9/22 - ER visit reviewed. He was given Paxlovid, Levaquin. He had abn CBC, glucose.  Outpatient Medications Prior to Visit  Medication Sig Dispense Refill   Cholecalciferol (VITAMIN D3) 2000 units capsule Take by mouth.     b complex vitamins tablet Take 1 tablet by mouth daily. (Patient not taking: Reported on 11/25/2020) 100 tablet 1   levofloxacin (LEVAQUIN) 500 MG tablet Take 1 tablet (500 mg total) by mouth daily. (Patient not taking: Reported on 11/25/2020) 7 tablet 0   meloxicam (MOBIC) 15 MG tablet Take 1 tablet (15 mg total) by mouth daily as needed for pain. With food (Patient not taking: Reported on 11/25/2020) 30 tablet 1   omeprazole (PRILOSEC) 40 MG capsule Take 1 capsule (40 mg total) by mouth daily. (Patient not taking: Reported on 11/25/2020) 90 capsule 3   No facility-administered medications prior to visit.    ROS: Review of Systems  Constitutional:  Positive for fatigue. Negative for appetite change and unexpected weight change.  HENT:  Negative for congestion, nosebleeds, sneezing, sore throat and trouble swallowing.   Eyes:  Negative for itching and visual disturbance.  Respiratory:  Positive for cough. Negative for shortness of breath.   Cardiovascular:  Negative for chest pain, palpitations and leg swelling.  Gastrointestinal:  Negative for abdominal distention, blood in stool, diarrhea and nausea.  Genitourinary:  Negative for frequency and hematuria.  Musculoskeletal:  Negative for back pain, gait problem, joint swelling and neck pain.  Skin:  Negative for rash.  Neurological:  Negative for dizziness, tremors, speech difficulty and weakness.  Psychiatric/Behavioral:  Negative for agitation, dysphoric mood and sleep disturbance. The patient is not  nervous/anxious.    Objective:  BP 120/80 (BP Location: Left Arm)   Pulse 75   Temp 98.3 F (36.8 C) (Oral)   Ht 6' (1.829 m)   Wt 252 lb 12.8 oz (114.7 kg)   SpO2 95%   BMI 34.29 kg/m   BP Readings from Last 3 Encounters:  11/25/20 120/80  11/03/20 (!) 174/100  02/09/18 102/63    Wt Readings from Last 3 Encounters:  11/25/20 252 lb 12.8 oz (114.7 kg)  11/02/20 251 lb (113.9 kg)  02/09/18 255 lb (115.7 kg)    Physical Exam Constitutional:      General: He is not in acute distress.    Appearance: He is well-developed. He is obese.     Comments: NAD  Eyes:     Conjunctiva/sclera: Conjunctivae normal.     Pupils: Pupils are equal, round, and reactive to light.  Neck:     Thyroid: No thyromegaly.     Vascular: No JVD.  Cardiovascular:     Rate and Rhythm: Normal rate and regular rhythm.     Heart sounds: Normal heart sounds. No murmur heard.   No friction rub. No gallop.  Pulmonary:     Effort: Pulmonary effort is normal. No respiratory distress.     Breath sounds: Normal breath sounds. No wheezing or rales.  Chest:     Chest wall: No tenderness.  Abdominal:     General: Bowel sounds are normal. There is no distension.     Palpations: Abdomen is soft. There is no mass.     Tenderness: There is no abdominal tenderness.  There is no guarding or rebound.  Musculoskeletal:        General: No tenderness. Normal range of motion.     Cervical back: Normal range of motion.  Lymphadenopathy:     Cervical: No cervical adenopathy.  Skin:    General: Skin is warm and dry.     Findings: No rash.  Neurological:     Mental Status: He is alert and oriented to person, place, and time.     Cranial Nerves: No cranial nerve deficit.     Motor: No abnormal muscle tone.     Coordination: Coordination normal.     Gait: Gait normal.     Deep Tendon Reflexes: Reflexes are normal and symmetric.  Psychiatric:        Behavior: Behavior normal.        Thought Content: Thought content  normal.        Judgment: Judgment normal.    Lab Results  Component Value Date   WBC 15.4 (H) 11/02/2020   HGB 17.4 (H) 11/02/2020   HCT 53.6 (H) 11/02/2020   PLT 231 11/02/2020   GLUCOSE 140 (H) 11/02/2020   CHOL 196 01/15/2018   TRIG 208.0 (H) 01/15/2018   HDL 37.50 (L) 01/15/2018   LDLDIRECT 135.0 01/15/2018   LDLCALC 133 (H) 01/13/2017   ALT 38 11/02/2020   AST 32 11/02/2020   NA 137 11/02/2020   K 4.0 11/02/2020   CL 99 11/02/2020   CREATININE 1.40 (H) 11/02/2020   BUN 15 11/02/2020   CO2 24 11/02/2020   TSH 1.43 01/15/2018   PSA 0.57 01/15/2018    DG Chest 2 View  Result Date: 11/02/2020 CLINICAL DATA:  Coarse lung sounds. Technologist notes state COVID. Fever and body aches. EXAM: CHEST - 2 VIEW COMPARISON:  01/14/2014 FINDINGS: The cardiomediastinal contours are normal. Minimal subsegmental opacities at the right lung base. Pulmonary vasculature is normal. No consolidation, pleural effusion, or pneumothorax. No acute osseous abnormalities are seen. IMPRESSION: Minimal subsegmental opacities at the right lung base, favor atelectasis. Electronically Signed   By: Keith Rake M.D.   On: 11/02/2020 21:08    Assessment & Plan:   Problem List Items Addressed This Visit     Abnormal CBC    due to ?CAP during [place]. Repeat CBC      Relevant Orders   CBC with Differential/Platelet   Decreased GFR    due to ?dehydration during COVID Check CMET      Relevant Orders   Comprehensive metabolic panel   Elevated glucose    Mild - check A1c      Relevant Orders   CBC with Differential/Platelet   Hemoglobin A1c   Fatigue     Post-Covid: SOB, cough, fatigue Pt declined w/up, meds Will watch      History of COVID-19     Pt took Levaquin, Paxlovid.  SOB, cough, fatigue         Follow-up: Return in about 3 months (around 02/25/2021) for a follow-up visit.  Walker Kehr, MD

## 2020-11-25 NOTE — Assessment & Plan Note (Signed)
Mild - check A1c

## 2020-12-01 ENCOUNTER — Ambulatory Visit: Payer: 59 | Admitting: Internal Medicine

## 2021-12-16 ENCOUNTER — Encounter: Payer: Self-pay | Admitting: Internal Medicine

## 2021-12-16 ENCOUNTER — Ambulatory Visit (INDEPENDENT_AMBULATORY_CARE_PROVIDER_SITE_OTHER): Payer: 59 | Admitting: Internal Medicine

## 2021-12-16 VITALS — BP 112/72 | HR 77 | Temp 97.8°F | Ht 72.0 in | Wt 258.9 lb

## 2021-12-16 DIAGNOSIS — Z0000 Encounter for general adult medical examination without abnormal findings: Secondary | ICD-10-CM | POA: Diagnosis not present

## 2021-12-16 DIAGNOSIS — Z23 Encounter for immunization: Secondary | ICD-10-CM

## 2021-12-16 LAB — CBC WITH DIFFERENTIAL/PLATELET
Basophils Absolute: 0 10*3/uL (ref 0.0–0.1)
Basophils Relative: 0.3 % (ref 0.0–3.0)
Eosinophils Absolute: 0.2 10*3/uL (ref 0.0–0.7)
Eosinophils Relative: 2 % (ref 0.0–5.0)
HCT: 46.4 % (ref 39.0–52.0)
Hemoglobin: 15.2 g/dL (ref 13.0–17.0)
Lymphocytes Relative: 31.7 % (ref 12.0–46.0)
Lymphs Abs: 2.5 10*3/uL (ref 0.7–4.0)
MCHC: 32.8 g/dL (ref 30.0–36.0)
MCV: 87.4 fl (ref 78.0–100.0)
Monocytes Absolute: 0.8 10*3/uL (ref 0.1–1.0)
Monocytes Relative: 10.1 % (ref 3.0–12.0)
Neutro Abs: 4.5 10*3/uL (ref 1.4–7.7)
Neutrophils Relative %: 55.9 % (ref 43.0–77.0)
Platelets: 230 10*3/uL (ref 150.0–400.0)
RBC: 5.32 Mil/uL (ref 4.22–5.81)
RDW: 14.1 % (ref 11.5–15.5)
WBC: 8 10*3/uL (ref 4.0–10.5)

## 2021-12-16 LAB — COMPREHENSIVE METABOLIC PANEL
ALT: 35 U/L (ref 0–53)
AST: 23 U/L (ref 0–37)
Albumin: 4.6 g/dL (ref 3.5–5.2)
Alkaline Phosphatase: 67 U/L (ref 39–117)
BUN: 13 mg/dL (ref 6–23)
CO2: 30 mEq/L (ref 19–32)
Calcium: 9.6 mg/dL (ref 8.4–10.5)
Chloride: 103 mEq/L (ref 96–112)
Creatinine, Ser: 1.15 mg/dL (ref 0.40–1.50)
GFR: 70.15 mL/min (ref >60.00)
Glucose, Bld: 107 mg/dL — ABNORMAL HIGH (ref 70–99)
Potassium: 4.1 mEq/L (ref 3.5–5.1)
Sodium: 140 mEq/L (ref 135–145)
Total Bilirubin: 0.6 mg/dL (ref 0.2–1.2)
Total Protein: 7.5 g/dL (ref 6.0–8.3)

## 2021-12-16 LAB — URINALYSIS
Bilirubin Urine: NEGATIVE
Hgb urine dipstick: NEGATIVE
Ketones, ur: NEGATIVE
Leukocytes,Ua: NEGATIVE
Nitrite: NEGATIVE
Specific Gravity, Urine: 1.02 (ref 1.000–1.030)
Total Protein, Urine: NEGATIVE
Urine Glucose: NEGATIVE
Urobilinogen, UA: 0.2 (ref 0.0–1.0)
pH: 7 (ref 5.0–8.0)

## 2021-12-16 LAB — LIPID PANEL
Cholesterol: 204 mg/dL — ABNORMAL HIGH (ref 0–200)
HDL: 41.6 mg/dL (ref >39.00)
LDL Cholesterol: 125 mg/dL — ABNORMAL HIGH (ref 0–99)
NonHDL: 162.57
Total CHOL/HDL Ratio: 5
Triglycerides: 187 mg/dL — ABNORMAL HIGH (ref 0.0–149.0)
VLDL: 37.4 mg/dL (ref 0.0–40.0)

## 2021-12-16 LAB — PSA: PSA: 0.47 ng/mL (ref 0.10–4.00)

## 2021-12-16 LAB — TSH: TSH: 1.56 u[IU]/mL (ref 0.35–5.50)

## 2021-12-16 NOTE — Progress Notes (Signed)
Subjective:  Patient ID: Bryan Ward, male    DOB: 1965/05/30  Age: 56 y.o. MRN: [id number]  CC: Annual Exam   HPI Bryan Ward presents for a well exam  Outpatient Medications Prior to Visit  Medication Sig Dispense Refill   Cholecalciferol (VITAMIN D3) 2000 units capsule Take by mouth.     No facility-administered medications prior to visit.    ROS: Review of Systems  Constitutional:  Negative for appetite change, fatigue and unexpected weight change.  HENT:  Negative for congestion, nosebleeds, sneezing, sore throat and trouble swallowing.   Eyes:  Negative for itching and visual disturbance.  Respiratory:  Negative for cough.   Cardiovascular:  Negative for chest pain, palpitations and leg swelling.  Gastrointestinal:  Negative for abdominal distention, blood in stool, diarrhea and nausea.  Genitourinary:  Negative for frequency and hematuria.  Musculoskeletal:  Negative for back pain, gait problem, joint swelling and neck pain.  Skin:  Negative for rash.  Neurological:  Negative for dizziness, tremors, speech difficulty and weakness.  Psychiatric/Behavioral:  Negative for agitation, dysphoric mood and sleep disturbance. The patient is not nervous/anxious.     Objective:  BP 112/72 (BP Location: Left Arm)   Pulse 77   Temp 97.8 F (36.6 C) (Oral)   Ht 6' (1.829 m)   Wt 258 lb 14.4 oz (117.4 kg)   SpO2 94%   BMI 35.11 kg/m   BP Readings from Last 3 Encounters:  12/16/21 112/72  11/25/20 120/80  11/03/20 (!) 174/100    Wt Readings from Last 3 Encounters:  12/16/21 258 lb 14.4 oz (117.4 kg)  11/25/20 252 lb 12.8 oz (114.7 kg)  11/02/20 251 lb (113.9 kg)    Physical Exam Constitutional:      General: He is not in acute distress.    Appearance: He is well-developed. He is obese.     Comments: NAD  Eyes:     Conjunctiva/sclera: Conjunctivae normal.     Pupils: Pupils are equal, round, and reactive to light.  Neck:     Thyroid: No  thyromegaly.     Vascular: No JVD.  Cardiovascular:     Rate and Rhythm: Normal rate and regular rhythm.     Heart sounds: Normal heart sounds. No murmur heard.    No friction rub. No gallop.  Pulmonary:     Effort: Pulmonary effort is normal. No respiratory distress.     Breath sounds: Normal breath sounds. No wheezing or rales.  Chest:     Chest wall: No tenderness.  Abdominal:     General: Bowel sounds are normal. There is no distension.     Palpations: Abdomen is soft. There is no mass.     Tenderness: There is no abdominal tenderness. There is no guarding or rebound.  Musculoskeletal:        General: No tenderness. Normal range of motion.     Cervical back: Normal range of motion.  Lymphadenopathy:     Cervical: No cervical adenopathy.  Skin:    General: Skin is warm and dry.     Findings: No rash.  Neurological:     Mental Status: He is alert and oriented to person, place, and time.     Cranial Nerves: No cranial nerve deficit.     Motor: No abnormal muscle tone.     Coordination: Coordination normal.     Gait: Gait normal.     Deep Tendon Reflexes: Reflexes are normal and symmetric.  Psychiatric:  Behavior: Behavior normal.        Thought Content: Thought content normal.        Judgment: Judgment normal.   Rectal deferred - per Dr Hilarie Fredrickson  Lab Results  Component Value Date   WBC 7.1 11/25/2020   HGB 13.9 11/25/2020   HCT 42.3 11/25/2020   PLT 248.0 11/25/2020   GLUCOSE 103 (H) 11/25/2020   CHOL 196 01/15/2018   TRIG 208.0 (H) 01/15/2018   HDL 37.50 (L) 01/15/2018   LDLDIRECT 135.0 01/15/2018   LDLCALC 133 (H) 01/13/2017   ALT 27 11/25/2020   AST 19 11/25/2020   NA 140 11/25/2020   K 4.2 11/25/2020   CL 105 11/25/2020   CREATININE 1.14 11/25/2020   BUN 12 11/25/2020   CO2 28 11/25/2020   TSH 1.43 01/15/2018   PSA 0.57 01/15/2018   HGBA1C 6.3 11/25/2020    DG Chest 2 View  Result Date: 11/02/2020 CLINICAL DATA:  Coarse lung sounds.  Technologist notes state COVID. Fever and body aches. EXAM: CHEST - 2 VIEW COMPARISON:  01/14/2014 FINDINGS: The cardiomediastinal contours are normal. Minimal subsegmental opacities at the right lung base. Pulmonary vasculature is normal. No consolidation, pleural effusion, or pneumothorax. No acute osseous abnormalities are seen. IMPRESSION: Minimal subsegmental opacities at the right lung base, favor atelectasis. Electronically Signed   By: Keith Rake M.D.   On: 11/02/2020 21:08    Assessment & Plan:   Problem List Items Addressed This Visit     Well adult exam - Primary    We discussed age appropriate health related issues, including available/recomended screening tests and vaccinations. We discussed a need for adhering to healthy diet and exercise. Labs were ordered to be later reviewed . All questions were answered. CT ca scoring test offered as an option Colon due in 2019 Dr Hilarie Fredrickson, due in 2024      Relevant Orders   TSH   Urinalysis   CBC with Differential/Platelet   Lipid panel   PSA   Comprehensive metabolic panel      No orders of the defined types were placed in this encounter.     Follow-up: Return in about 1 year (around 12/17/2022) for Wellness Exam.  Walker Kehr, MD

## 2021-12-16 NOTE — Assessment & Plan Note (Signed)
We discussed age appropriate health related issues, including available/recomended screening tests and vaccinations. We discussed a need for adhering to healthy diet and exercise. Labs were ordered to be later reviewed . All questions were answered. CT ca scoring test offered as an option Colon due in 2019 Dr Hilarie Fredrickson, due in 2024

## 2022-11-01 ENCOUNTER — Telehealth: Payer: Self-pay | Admitting: Internal Medicine

## 2022-11-01 NOTE — Telephone Encounter (Signed)
Pt is requesting a call from Dr. Posey Rea, pt will disclose any information to me. I told the pt he would need an appointment if he wanted to speak with his provider and he became very upset, stating he has an appointment in November and shouldn't need a different appointment just to talk to Plotnikov.

## 2022-11-07 NOTE — Telephone Encounter (Signed)
I called. No answer. Left a vm thx

## 2022-12-21 ENCOUNTER — Encounter: Payer: Self-pay | Admitting: Internal Medicine

## 2022-12-21 ENCOUNTER — Ambulatory Visit: Payer: 59 | Admitting: Internal Medicine

## 2022-12-21 VITALS — BP 128/88 | HR 78 | Temp 98.1°F | Ht 72.0 in | Wt 259.8 lb

## 2022-12-21 DIAGNOSIS — R739 Hyperglycemia, unspecified: Secondary | ICD-10-CM

## 2022-12-21 DIAGNOSIS — R944 Abnormal results of kidney function studies: Secondary | ICD-10-CM

## 2022-12-21 DIAGNOSIS — K219 Gastro-esophageal reflux disease without esophagitis: Secondary | ICD-10-CM

## 2022-12-21 DIAGNOSIS — Z0000 Encounter for general adult medical examination without abnormal findings: Secondary | ICD-10-CM | POA: Diagnosis not present

## 2022-12-21 DIAGNOSIS — Z1211 Encounter for screening for malignant neoplasm of colon: Secondary | ICD-10-CM

## 2022-12-21 DIAGNOSIS — Z23 Encounter for immunization: Secondary | ICD-10-CM | POA: Diagnosis not present

## 2022-12-21 DIAGNOSIS — Z13220 Encounter for screening for lipoid disorders: Secondary | ICD-10-CM | POA: Diagnosis not present

## 2022-12-21 LAB — CBC WITH DIFFERENTIAL/PLATELET
Basophils Absolute: 0 10*3/uL (ref 0.0–0.1)
Basophils Relative: 0.4 % (ref 0.0–3.0)
Eosinophils Absolute: 0.1 10*3/uL (ref 0.0–0.7)
Eosinophils Relative: 1.9 % (ref 0.0–5.0)
HCT: 47.1 % (ref 39.0–52.0)
Hemoglobin: 15.2 g/dL (ref 13.0–17.0)
Lymphocytes Relative: 29.9 % (ref 12.0–46.0)
Lymphs Abs: 2.3 10*3/uL (ref 0.7–4.0)
MCHC: 32.2 g/dL (ref 30.0–36.0)
MCV: 90.5 fL (ref 78.0–100.0)
Monocytes Absolute: 0.7 10*3/uL (ref 0.1–1.0)
Monocytes Relative: 8.9 % (ref 3.0–12.0)
Neutro Abs: 4.6 10*3/uL (ref 1.4–7.7)
Neutrophils Relative %: 58.9 % (ref 43.0–77.0)
Platelets: 244 10*3/uL (ref 150.0–400.0)
RBC: 5.21 Mil/uL (ref 4.22–5.81)
RDW: 13.9 % (ref 11.5–15.5)
WBC: 7.8 10*3/uL (ref 4.0–10.5)

## 2022-12-21 LAB — URINALYSIS
Bilirubin Urine: NEGATIVE
Hgb urine dipstick: NEGATIVE
Ketones, ur: NEGATIVE
Leukocytes,Ua: NEGATIVE
Nitrite: NEGATIVE
Specific Gravity, Urine: 1.015 (ref 1.000–1.030)
Total Protein, Urine: NEGATIVE
Urine Glucose: NEGATIVE
Urobilinogen, UA: 0.2 (ref 0.0–1.0)
pH: 7 (ref 5.0–8.0)

## 2022-12-21 LAB — LIPID PANEL
Cholesterol: 196 mg/dL (ref 0–200)
HDL: 44.8 mg/dL (ref >39.00)
LDL Cholesterol: 111 mg/dL — ABNORMAL HIGH (ref 0–99)
NonHDL: 150.85
Total CHOL/HDL Ratio: 4
Triglycerides: 198 mg/dL — ABNORMAL HIGH (ref 0.0–149.0)
VLDL: 39.6 mg/dL (ref 0.0–40.0)

## 2022-12-21 LAB — COMPREHENSIVE METABOLIC PANEL
ALT: 29 U/L (ref 0–53)
AST: 24 U/L (ref 0–37)
Albumin: 4.5 g/dL (ref 3.5–5.2)
Alkaline Phosphatase: 65 U/L (ref 39–117)
BUN: 10 mg/dL (ref 6–23)
CO2: 30 meq/L (ref 19–32)
Calcium: 9.5 mg/dL (ref 8.4–10.5)
Chloride: 105 meq/L (ref 96–112)
Creatinine, Ser: 1.22 mg/dL (ref 0.40–1.50)
GFR: 64.88 mL/min (ref >60.00)
Glucose, Bld: 115 mg/dL — ABNORMAL HIGH (ref 70–99)
Potassium: 4.6 meq/L (ref 3.5–5.1)
Sodium: 142 meq/L (ref 135–145)
Total Bilirubin: 0.8 mg/dL (ref 0.2–1.2)
Total Protein: 7.3 g/dL (ref 6.0–8.3)

## 2022-12-21 LAB — PSA: PSA: 0.68 ng/mL (ref 0.10–4.00)

## 2022-12-21 LAB — TSH: TSH: 1.36 u[IU]/mL (ref 0.35–5.50)

## 2022-12-21 MED ORDER — PANTOPRAZOLE SODIUM 40 MG PO TBEC: 40.0000 mg | DELAYED_RELEASE_TABLET | Freq: Every day | ORAL | 3 refills | Status: DC

## 2022-12-21 NOTE — Progress Notes (Addendum)
Subjective:  Patient ID: Bryan Ward, male    DOB: [birth year]  Age: 57 y.o. MRN: [id number]  CC: No chief complaint on file.   HPI Bryan Ward presents for a well exam C/o L ear "clogged" sensation - pt irrigated his ear  Outpatient Medications Prior to Visit  Medication Sig Dispense Refill   Cholecalciferol (VITAMIN D3) 2000 units capsule Take by mouth.     No facility-administered medications prior to visit.    ROS: Review of Systems  Constitutional:  Negative for appetite change, fatigue and unexpected weight change.  HENT:  Positive for hearing loss. Negative for congestion, nosebleeds, sneezing, sore throat and trouble swallowing.   Eyes:  Negative for itching and visual disturbance.  Respiratory:  Negative for cough.   Cardiovascular:  Negative for chest pain, palpitations and leg swelling.  Gastrointestinal:  Negative for abdominal distention, blood in stool, diarrhea and nausea.  Genitourinary:  Negative for frequency and hematuria.  Musculoskeletal:  Negative for back pain, gait problem, joint swelling and neck pain.  Skin:  Negative for rash.  Neurological:  Negative for dizziness, tremors, speech difficulty and weakness.  Psychiatric/Behavioral:  Negative for agitation, dysphoric mood and sleep disturbance. The patient is not nervous/anxious.     Objective:  BP 128/88 (BP Location: Left Arm, Patient Position: Sitting, Cuff Size: Normal)   Pulse 78   Temp 98.1 F (36.7 C) (Oral)   Ht 6' (1.829 m)   Wt 259 lb 12.8 oz (117.8 kg)   SpO2 94%   BMI 35.24 kg/m   BP Readings from Last 3 Encounters:  12/21/22 128/88  12/16/21 112/72  11/25/20 120/80    Wt Readings from Last 3 Encounters:  12/21/22 259 lb 12.8 oz (117.8 kg)  12/16/21 258 lb 14.4 oz (117.4 kg)  11/25/20 252 lb 12.8 oz (114.7 kg)    Physical Exam Constitutional:      General: He is not in acute distress.    Appearance: He is well-developed. He is obese.     Comments: NAD   Eyes:     Conjunctiva/sclera: Conjunctivae normal.     Pupils: Pupils are equal, round, and reactive to light.  Neck:     Thyroid: No thyromegaly.     Vascular: No JVD.  Cardiovascular:     Rate and Rhythm: Normal rate and regular rhythm.     Heart sounds: Normal heart sounds. No murmur heard.    No friction rub. No gallop.  Pulmonary:     Effort: Pulmonary effort is normal. No respiratory distress.     Breath sounds: Normal breath sounds. No wheezing or rales.  Chest:     Chest wall: No tenderness.  Abdominal:     General: Bowel sounds are normal. There is no distension.     Palpations: Abdomen is soft. There is no mass.     Tenderness: There is no abdominal tenderness. There is no guarding or rebound.  Musculoskeletal:        General: No tenderness. Normal range of motion.     Cervical back: Normal range of motion.  Lymphadenopathy:     Cervical: No cervical adenopathy.  Skin:    General: Skin is warm and dry.     Findings: No rash.  Neurological:     Mental Status: He is alert and oriented to person, place, and time.     Cranial Nerves: No cranial nerve deficit.     Motor: No abnormal muscle tone.     Coordination: Coordination  normal.     Gait: Gait normal.     Deep Tendon Reflexes: Reflexes are normal and symmetric.  Psychiatric:        Behavior: Behavior normal.        Thought Content: Thought content normal.        Judgment: Judgment normal.   No wax B Rectal per GI  Lab Results  Component Value Date   WBC 7.8 12/21/2022   HGB 15.2 12/21/2022   HCT 47.1 12/21/2022   PLT 244.0 12/21/2022   GLUCOSE 115 (H) 12/21/2022   CHOL 196 12/21/2022   TRIG 198.0 (H) 12/21/2022   HDL 44.80 12/21/2022   LDLDIRECT 135.0 01/15/2018   LDLCALC 111 (H) 12/21/2022   ALT 29 12/21/2022   AST 24 12/21/2022   NA 142 12/21/2022   K 4.6 12/21/2022   CL 105 12/21/2022   CREATININE 1.22 12/21/2022   BUN 10 12/21/2022   CO2 30 12/21/2022   TSH 1.36 12/21/2022   PSA 0.68  12/21/2022   HGBA1C 6.3 11/25/2020    DG Chest 2 View  Result Date: 11/02/2020 CLINICAL DATA:  Coarse lung sounds. Technologist notes state COVID. Fever and body aches. EXAM: CHEST - 2 VIEW COMPARISON:  01/14/2014 FINDINGS: The cardiomediastinal contours are normal. Minimal subsegmental opacities at the right lung base. Pulmonary vasculature is normal. No consolidation, pleural effusion, or pneumothorax. No acute osseous abnormalities are seen. IMPRESSION: Minimal subsegmental opacities at the right lung base, favor atelectasis. Electronically Signed   By: Narda Rutherford M.D.   On: 11/02/2020 21:08    Assessment & Plan:   Problem List Items Addressed This Visit     Well adult exam - Primary    We discussed age appropriate health related issues, including available/recomended screening tests and vaccinations. We discussed a need for adhering to healthy diet and exercise. Labs were ordered to be later reviewed . All questions were answered. CT ca scoring test offered as an option Colon due in 2019 Dr Rhea Belton, due in 2024      Relevant Orders   TSH (Completed)   Urinalysis (Completed)   CBC with Differential/Platelet (Completed)   Lipid panel (Completed)   PSA (Completed)   Comprehensive metabolic panel (Completed)   GERD (gastroesophageal reflux disease)    Protonix po Rx F/u w/GI      Relevant Medications   pantoprazole (PROTONIX) 40 MG tablet   Decreased GFR    Check GFR      Hyperglycemia    Prediabetes.  Cut back on carbs, lose weight, exercise.  Return to clinic in 6 months with A1c      Relevant Orders   Hemoglobin A1c   Comprehensive metabolic panel   Other Visit Diagnoses     Screening for colon cancer       Relevant Orders   Ambulatory referral to Gastroenterology   Need for vaccination       Relevant Orders   Flu vaccine trivalent PF, 6mos and older(Flulaval,Afluria,Fluarix,Fluzone) (Completed)         Meds ordered this encounter  Medications    pantoprazole (PROTONIX) 40 MG tablet    Sig: Take 1 tablet (40 mg total) by mouth daily.    Dispense:  90 tablet    Refill:  3      Follow-up: Return in about 1 year (around 12/21/2023) for Wellness Exam.  Sonda Primes, MD

## 2022-12-21 NOTE — Assessment & Plan Note (Signed)
We discussed age appropriate health related issues, including available/recomended screening tests and vaccinations. We discussed a need for adhering to healthy diet and exercise. Labs were ordered to be later reviewed . All questions were answered. CT ca scoring test offered as an option Colon due in 2019 Dr Hilarie Fredrickson, due in 2024

## 2022-12-21 NOTE — Addendum Note (Signed)
Addended by: Aundra Millet on: 12/21/2022 10:46 AM   Modules accepted: Orders

## 2022-12-21 NOTE — Assessment & Plan Note (Signed)
 Check GFR

## 2022-12-21 NOTE — Assessment & Plan Note (Signed)
Protonix po Rx F/u w/GI

## 2022-12-28 DIAGNOSIS — R739 Hyperglycemia, unspecified: Secondary | ICD-10-CM | POA: Insufficient documentation

## 2022-12-28 NOTE — Assessment & Plan Note (Signed)
Prediabetes.  Cut back on carbs, lose weight, exercise.  Return to clinic in 6 months with A1c

## 2022-12-28 NOTE — Addendum Note (Signed)
Addended by: Tresa Garter on: 12/28/2022 12:10 AM   Modules accepted: Orders

## 2023-01-09 ENCOUNTER — Encounter: Payer: Self-pay | Admitting: Internal Medicine

## 2023-04-10 ENCOUNTER — Encounter: Payer: Self-pay | Admitting: Gastroenterology

## 2023-04-10 ENCOUNTER — Ambulatory Visit (INDEPENDENT_AMBULATORY_CARE_PROVIDER_SITE_OTHER): Payer: 59 | Admitting: Gastroenterology

## 2023-04-10 VITALS — BP 118/80 | HR 80 | Ht 72.05 in | Wt 260.8 lb

## 2023-04-10 DIAGNOSIS — Z8719 Personal history of other diseases of the digestive system: Secondary | ICD-10-CM

## 2023-04-10 DIAGNOSIS — Z860100 Personal history of colon polyps, unspecified: Secondary | ICD-10-CM

## 2023-04-10 DIAGNOSIS — Z800 Family history of malignant neoplasm of digestive organs: Secondary | ICD-10-CM | POA: Diagnosis not present

## 2023-04-10 DIAGNOSIS — K219 Gastro-esophageal reflux disease without esophagitis: Secondary | ICD-10-CM | POA: Diagnosis not present

## 2023-04-10 MED ORDER — OMEPRAZOLE 40 MG PO CPDR: 40.0000 mg | DELAYED_RELEASE_CAPSULE | Freq: Every day | ORAL | 12 refills | Status: DC

## 2023-04-10 MED ORDER — SUFLAVE 178.7 G PO SOLR: 1.0000 | Freq: Once | ORAL | 0 refills | Status: AC

## 2023-04-10 NOTE — Progress Notes (Signed)
 Chief Complaint:: Recall Primary GI MD: Dr. Rhea Belton  HPI: 58 year old male with medical history as listed below presents for evaluation of colonoscopy recall  Had EGD/colonoscopy December 2019 for history of colon polyps and longstanding GERD.  See details below.  EGD showed esophagitis with biopsies negative for intestinal metaplasia and H. pylori.  Also showed a hiatal hernia.  Colonoscopy was normal and a 5-year repeat was recommended due to history of sessile serrated polyp greater than 10 mm on colonoscopy in 2016.  Patient is here today and reports he is doing well.  He has intermittent GERD mainly with trigger foods such as spicy food.  He takes his pantoprazole on an as-needed basis work which works well for him.  Although he did feel omeprazole worked better and he would like to switch back to that.  He would like an endoscopy with his colonoscopy due to his family history of esophageal cancer in his father   PREVIOUS GI WORKUP   EGD 01/2018 - LA Grade A reflux esophagitis. Biopsied.  - 2 cm hiatal hernia.  - Normal stomach.  - Normal examined duodenum.  Diagnosis Surgical [P], GE junction - GASTROESOPHAGEAL MUCOSA WITH INFLAMMATION CONSISTENT WITH REFLUX. - NO INTESTINAL METAPLASIA, DYSPLASIA, OR MALIGNANCY.  Colonoscopy 01/2018 due to personal history of sessile serrated colon polyp greater than 10 mm would last colonoscopy 3 years prior - The entire examined colon is normal on direct and retroflexion views.  - No specimens collected. - repeat 5 years (01/2023)  Past Medical History:  Diagnosis Date   Helicobacter pylori antibody positive    Hx of adenomatous polyp of colon     Past Surgical History:  Procedure Laterality Date   COLONOSCOPY     KNEE ARTHROSCOPY W/ LASER Left 2002   Dr Thurston Hole   POLYPECTOMY      Current Outpatient Medications  Medication Sig Dispense Refill   Cholecalciferol (VITAMIN D3) 2000 units capsule Take by mouth.     pantoprazole  (PROTONIX) 40 MG tablet Take 1 tablet (40 mg total) by mouth daily. 90 tablet 3   No current facility-administered medications for this visit.    Allergies as of 04/10/2023   (No Known Allergies)    Family History  Problem Relation Age of Onset   Arthritis Father    Esophageal cancer Father 57   Liver cancer Sister    Colon cancer Neg Hx    Colon polyps Neg Hx    Diabetes Neg Hx    Kidney disease Neg Hx    Heart disease Neg Hx     Social History   Socioeconomic History   Marital status: Married    Spouse name: Not on file   Number of children: 1   Years of education: Not on file   Highest education level: Not on file  Occupational History   Occupation: Location manager  Tobacco Use   Smoking status: Former    Current packs/day: 0.00    Types: Cigarettes    Quit date: 10/15/2013    Years since quitting: 9.4   Smokeless tobacco: Never  Substance and Sexual Activity   Alcohol use: Yes    Alcohol/week: 0.0 standard drinks of alcohol    Comment: Occassionally   Drug use: No   Sexual activity: Yes  Other Topics Concern   Not on file  Social History Narrative   Not on file   Social Drivers of Health   Financial Resource Strain: Not on file  Food Insecurity: Not on  file  Transportation Needs: Not on file  Physical Activity: Not on file  Stress: Not on file  Social Connections: Unknown (06/29/2021)   Received from [facility], [facility]   Social Network    Social Network: Not on file  Intimate Partner Violence: Unknown (05/21/2021)   Received from [facility], [facility]   HITS    Physically Hurt: Not on file    Insult or Talk Down To: Not on file    Threaten Physical Harm: Not on file    Scream or Curse: Not on file    Review of Systems:    Constitutional: No weight loss, fever, chills, weakness or fatigue HEENT: Eyes: No change in vision               Ears, Nose, Throat:  No change in hearing or congestion Skin: No rash or  itching Cardiovascular: No chest pain, chest pressure or palpitations   Respiratory: No SOB or cough Gastrointestinal: See HPI and otherwise negative Genitourinary: No dysuria or change in urinary frequency Neurological: No headache, dizziness or syncope Musculoskeletal: No new muscle or joint pain Hematologic: No bleeding or bruising Psychiatric: No history of depression or anxiety    Physical Exam:  Vital signs: Ht 6' 0.05" (1.83 m)   Wt 260 lb 12.8 oz (118.3 kg)   BMI 35.32 kg/m   Constitutional: NAD, Well developed, Well nourished, alert and cooperative Head:  Normocephalic and atraumatic. Eyes:   PEERL, EOMI. No icterus. Conjunctiva pink. Respiratory: Respirations even and unlabored. Lungs clear to auscultation bilaterally.   No wheezes, crackles, or rhonchi.  Cardiovascular:  Regular rate and rhythm. No peripheral edema, cyanosis or pallor.  Gastrointestinal:  Soft, nondistended, nontender. No rebound or guarding. Normal bowel sounds. No appreciable masses or hepatomegaly. Rectal:  Not performed.  Msk:  Symmetrical without gross deformities. Without edema, no deformity or joint abnormality.  Neurologic:  Alert and  oriented x4;  grossly normal neurologically.  Skin:   Dry and intact without significant lesions or rashes. Psychiatric: Oriented to person, place and time. Demonstrates good judgement and reason without abnormal affect or behaviors.   RELEVANT LABS AND IMAGING: CBC    Component Value Date/Time   WBC 7.8 12/21/2022 1047   RBC 5.21 12/21/2022 1047   HGB 15.2 12/21/2022 1047   HCT 47.1 12/21/2022 1047   PLT 244.0 12/21/2022 1047   MCV 90.5 12/21/2022 1047   MCH 28.8 11/02/2020 2146   MCHC 32.2 12/21/2022 1047   RDW 13.9 12/21/2022 1047   LYMPHSABS 2.3 12/21/2022 1047   MONOABS 0.7 12/21/2022 1047   EOSABS 0.1 12/21/2022 1047   BASOSABS 0.0 12/21/2022 1047    CMP     Component Value Date/Time   NA 142 12/21/2022 1047   K 4.6 12/21/2022 1047   CL  105 12/21/2022 1047   CO2 30 12/21/2022 1047   GLUCOSE 115 (H) 12/21/2022 1047   BUN 10 12/21/2022 1047   CREATININE 1.22 12/21/2022 1047   CALCIUM 9.5 12/21/2022 1047   PROT 7.3 12/21/2022 1047   ALBUMIN 4.5 12/21/2022 1047   AST 24 12/21/2022 1047   ALT 29 12/21/2022 1047   ALKPHOS 65 12/21/2022 1047   BILITOT 0.8 12/21/2022 1047   GFRNONAA 59 (L) 11/02/2020 2146     Assessment/Plan:   GERD Longstanding history of GERD well-maintained on intermittent pantoprazole 40 Mg once daily (would like to switch back to omeprazole) with EGD in 2019 showing esophagitis and 2 cm hiatal hernia with negative biopsies.  He requests repeat EGD due to family history of esophageal cancer in father - Schedule EGD - I thoroughly discussed the procedure with the patient (at bedside) to include nature of the procedure, alternatives, benefits, and risks (including but not limited to bleeding, infection, perforation, anesthesia/cardiac pulmonary complications).  Patient verbalized understanding and gave verbal consent to proceed with procedure. - Omeprazole 40 Mg once daily  History of colon polyps Last colonoscopy December 2019 which was normal but due to previous history of colon polyps in 2016 colonoscopy repeat recommended 5 years.  No lower GI issues at this time. - Schedule colonoscopy - I thoroughly discussed the procedure with the patient (at bedside) to include nature of the procedure, alternatives, benefits, and risks (including but not limited to bleeding, infection, perforation, anesthesia/cardiac pulmonary complications).  Patient verbalized understanding and gave verbal consent to proceed with procedure.   Lara Mulch [facility] 04/10/2023, 10:39 AM  Cc: Tresa Garter, MD

## 2023-04-10 NOTE — Patient Instructions (Addendum)
 We have sent the following medications to your pharmacy for you to pick up at your convenience: Omeprazole   You have been scheduled for an endoscopy and colonoscopy. Please follow the written instructions given to you at your visit today.  If you use inhalers (even only as needed), please bring them with you on the day of your procedure.  DO NOT TAKE 7 DAYS PRIOR TO TEST- Trulicity (dulaglutide) Ozempic, Wegovy (semaglutide) Mounjaro (tirzepatide) Bydureon Bcise (exanatide extended release)  DO NOT TAKE 1 DAY PRIOR TO YOUR TEST Rybelsus (semaglutide) Adlyxin (lixisenatide) Victoza (liraglutide) Byetta (exanatide) ___________________________________________________________________________  Bonita Quin will receive your bowel preparation through [facility], which ensures the lowest copay and home delivery, with outreach via text or call from an [phone] number. Please respond promptly to avoid rescheduling of your procedure. If you are interested in alternative options or have any questions regarding your prep, please contact them at [phone] ____________________________________________________________________________  Your Provider Has Sent Your Bowel Prep Regimen To [facility]   [facility] will contact you to verify your information and collect your copay, if applicable. Enjoy the comfort of your home while your prescription is mailed to you, FREE of any shipping charges.   [facility] accepts all major insurance benefits and applies discounts & coupons.  Have additional questions?   Chat: [email or web address] Call: [phone] Email: [email or web address] NCPDP: [id number]  How will [facility] contact you?  With a Welcome phone call,  a Welcome text and a checkout link in text form.  Texts you receive from [phone] Are NOT Spam.  *To set up delivery, you must complete the checkout process via link or speak to one of the patient care representatives. If [facility] is  unable to reach you, your prescription may be delayed.  To avoid long hold times on the phone, you may also utilize the secure chat feature on the [facility] website to request that they call you back for transaction completion or to expedite your concerns.  _______________________________________________________  If your blood pressure at your visit was 140/90 or greater, please contact your primary care physician to follow up on this.  _______________________________________________________  If you are age 55 or older, your body mass index should be between 23-30. Your Body mass index is 35.32 kg/m. If this is out of the aforementioned range listed, please consider follow up with your Primary Care Provider.  If you are age 75 or younger, your body mass index should be between 19-25. Your Body mass index is 35.32 kg/m. If this is out of the aformentioned range listed, please consider follow up with your Primary Care Provider.   ________________________________________________________  The [place] [facility] providers would like to encourage you to use [facility] to communicate with providers for non-urgent requests or questions.  Due to long hold times on the telephone, sending your provider a message by [facility] may be a faster and more efficient way to get a response.  Please allow 48 business hours for a response.  Please remember that this is for non-urgent requests.  _______________________________________________________  Due to recent changes in healthcare laws, you may see the results of your imaging and laboratory studies on MyChart before your provider has had a chance to review them.  We understand that in some cases there may be results that are confusing or concerning to you. Not all laboratory results come back in the same time frame and the provider may be waiting for multiple results in order to interpret others.  Please give [place] 48 hours in order for your  provider to thoroughly review all  the results before contacting the office for clarification of your results.   Thank you for entrusting me with your care and choosing [facility].  Bayley Leanna Sato, PA-C

## 2023-04-10 NOTE — Progress Notes (Signed)
 Addendum: Reviewed and agree with assessment and management plan. Asha Grumbine, Carie Caddy, MD

## 2023-05-19 ENCOUNTER — Encounter: Payer: Self-pay | Admitting: Internal Medicine

## 2023-05-23 ENCOUNTER — Telehealth: Payer: Self-pay

## 2023-05-23 ENCOUNTER — Telehealth: Payer: Self-pay | Admitting: Gastroenterology

## 2023-05-23 NOTE — Telephone Encounter (Signed)
 Inbound call from patient, would like to speak with a nurse in regards to prep medication. Patient states his procedure is on 4/11 and is supposed to take it Thursday evening and [facility] is not responding or has yet to send his medication. Patient is anxious about the possibility of his procedure being cancelled due to not receiving his prep. If prep needs to be sent to pharmacy, patient would like it sent to [facility]

## 2023-05-23 NOTE — Telephone Encounter (Signed)
 Spoke to patient he stated he never received his prep from [facility] he is scheduled to have colonoscopy on 05/26/23. Advised patient he can come into office and pick up prep on the second floor. Patient agreed to pick up prep.

## 2023-05-26 ENCOUNTER — Ambulatory Visit: Payer: 59 | Admitting: Internal Medicine

## 2023-05-26 ENCOUNTER — Encounter: Payer: Self-pay | Admitting: Internal Medicine

## 2023-05-26 VITALS — BP 118/65 | HR 66 | Temp 97.4°F | Resp 10 | Ht 72.0 in | Wt 260.0 lb

## 2023-05-26 DIAGNOSIS — K449 Diaphragmatic hernia without obstruction or gangrene: Secondary | ICD-10-CM

## 2023-05-26 DIAGNOSIS — K648 Other hemorrhoids: Secondary | ICD-10-CM | POA: Diagnosis not present

## 2023-05-26 DIAGNOSIS — K2950 Unspecified chronic gastritis without bleeding: Secondary | ICD-10-CM

## 2023-05-26 DIAGNOSIS — K31A19 Gastric intestinal metaplasia without dysplasia, unspecified site: Secondary | ICD-10-CM | POA: Diagnosis not present

## 2023-05-26 DIAGNOSIS — Z1211 Encounter for screening for malignant neoplasm of colon: Secondary | ICD-10-CM | POA: Diagnosis present

## 2023-05-26 DIAGNOSIS — Z800 Family history of malignant neoplasm of digestive organs: Secondary | ICD-10-CM

## 2023-05-26 DIAGNOSIS — Z860100 Personal history of colon polyps, unspecified: Secondary | ICD-10-CM

## 2023-05-26 DIAGNOSIS — D125 Benign neoplasm of sigmoid colon: Secondary | ICD-10-CM

## 2023-05-26 DIAGNOSIS — K3189 Other diseases of stomach and duodenum: Secondary | ICD-10-CM | POA: Diagnosis not present

## 2023-05-26 DIAGNOSIS — K2970 Gastritis, unspecified, without bleeding: Secondary | ICD-10-CM

## 2023-05-26 DIAGNOSIS — K219 Gastro-esophageal reflux disease without esophagitis: Secondary | ICD-10-CM

## 2023-05-26 MED ORDER — SODIUM CHLORIDE 0.9 % IV SOLN: 500.0000 mL | Freq: Once | INTRAVENOUS | Status: DC

## 2023-05-26 MED ORDER — OMEPRAZOLE 40 MG PO CPDR
40.0000 mg | DELAYED_RELEASE_CAPSULE | Freq: Two times a day (BID) | ORAL | 3 refills | Status: AC
Start: 2023-05-26 — End: ?

## 2023-05-26 NOTE — Patient Instructions (Addendum)
 Take your omeprazole before breakfast and supper. Take it on an empty stomach only.  You need this medications due to a red stomach.  Your reflux is not under control.  Resume all of your previous medications today as ordered.  Read your discharge instructions.  YOU HAD AN ENDOSCOPIC PROCEDURE TODAY AT THE [facility]:   Refer to the procedure report that was given to you for any specific questions about what was found during the examination.  If the procedure report does not answer your questions, please call your gastroenterologist to clarify.  If you requested that your care partner not be given the details of your procedure findings, then the procedure report has been included in a sealed envelope for you to review at your convenience later.  YOU SHOULD EXPECT: Some feelings of bloating in the abdomen. Passage of more gas than usual.  Walking can help get rid of the air that was put into your GI tract during the procedure and reduce the bloating. If you had a lower endoscopy (such as a colonoscopy or flexible sigmoidoscopy) you may notice spotting of blood in your stool or on the toilet paper. If you underwent a bowel prep for your procedure, you may not have a normal bowel movement for a few days.  Please Note:  You might notice some irritation and congestion in your nose or some drainage.  This is from the oxygen used during your procedure.  There is no need for concern and it should clear up in a day or so.  SYMPTOMS TO REPORT IMMEDIATELY:  Following lower endoscopy (colonoscopy or flexible sigmoidoscopy):  Excessive amounts of blood in the stool  Significant tenderness or worsening of abdominal pains  Swelling of the abdomen that is new, acute  Fever of 100F or higher  Following upper endoscopy (EGD)  Vomiting of blood or coffee ground material  New chest pain or pain under the shoulder blades  Painful or persistently difficult swallowing  New shortness of breath  Fever of  100F or higher  Black, tarry-looking stools  For urgent or emergent issues, a gastroenterologist can be reached at any hour by calling [phone]. Do not use MyChart messaging for urgent concerns.    DIET:  We do recommend a small meal at first, but then you may proceed to your regular diet.  Drink plenty of fluids but you should avoid alcoholic beverages for 24 hours. Read the orange handout for food suggestions.  ACTIVITY:  You should plan to take it easy for the rest of today and you should NOT DRIVE or use heavy machinery until tomorrow (because of the sedation medicines used during the test).    FOLLOW UP: Our staff will call the number listed on your records the next business day following your procedure.  We will call around 7:15- 8:00 am to check on you and address any questions or concerns that you may have regarding the information given to you following your procedure. If we do not reach you, we will leave a message.     If any biopsies were taken you will be contacted by phone or by letter within the next 1-3 weeks.  Please call us at [phone] if you have not heard about the biopsies in 3 weeks.    SIGNATURES/CONFIDENTIALITY: You and/or your care partner have signed paperwork which will be entered into your electronic medical record.  These signatures attest to the fact that that the information above on your After Visit  Summary has been reviewed and is understood.  Full responsibility of the confidentiality of this discharge information lies with you and/or your care-partner.

## 2023-05-26 NOTE — Progress Notes (Signed)
 Vitals-CW  No changes since pre-visit.  BP high, but patint enies a "bad" headache". States not bad.  No number given.  No vision changes.  No nasuea or vomiting.  1055 Pt states no headache now.

## 2023-05-26 NOTE — Progress Notes (Signed)
 Vss nad trans to pacu

## 2023-05-26 NOTE — Op Note (Signed)
 [facility] Patient Name: Bryan Ward Procedure Date: 05/26/2023 11:17 AM MRN: [id number] Endoscopist: Beverley Fiedler , MD, [id number] Age: 58 Referring MD:  Date of Birth: 03/12/1965 Gender: Male Account #: [id number] Procedure:                Upper GI endoscopy Indications:              Gastro-esophageal reflux disease, Family history of                            esophageal cancer, currently on omeprazole 40 mg                            daily Medicines:                Monitored Anesthesia Care Procedure:                Pre-Anesthesia Assessment:                           - Prior to the procedure, a History and Physical                            was performed, and patient medications and                            allergies were reviewed. The patient's tolerance of                            previous anesthesia was also reviewed. The risks                            and benefits of the procedure and the sedation                            options and risks were discussed with the patient.                            All questions were answered, and informed consent                            was obtained. Prior Anticoagulants: The patient has                            taken no anticoagulant or antiplatelet agents. ASA                            Grade Assessment: II - A patient with mild systemic                            disease. After reviewing the risks and benefits,                            the patient was deemed in satisfactory condition to  undergo the procedure.                           After obtaining informed consent, the endoscope was                            passed under direct vision. Throughout the                            procedure, the patient's blood pressure, pulse, and                            oxygen saturations were monitored continuously. The                            Olympus Scope SN [id number] was introduced  through the                            mouth, and advanced to the second part of duodenum.                            The upper GI endoscopy was accomplished without                            difficulty. The patient tolerated the procedure                            well. Scope In: Scope Out: Findings:                 LA Grade B (one or more mucosal breaks greater than                            5 mm, not extending between the tops of two mucosal                            folds) esophagitis with no bleeding was found in                            the distal esophagus. Biopsies were taken with a                            cold forceps for histology.                           A 2 cm hiatal hernia was present.                           Patchy mild inflammation characterized by erosions                            and erythema was found in the gastric antrum.                            Biopsies  were taken with a cold forceps for                            histology.                           The examined duodenum was normal. Complications:            No immediate complications. Estimated Blood Loss:     Estimated blood loss was minimal. Impression:               - LA Grade B reflux esophagitis with no bleeding.                            Biopsied.                           - 2 cm hiatal hernia.                           - Gastritis. Biopsied.                           - Normal examined duodenum. Recommendation:           - Patient has a contact number available for                            emergencies. The signs and symptoms of potential                            delayed complications were discussed with the                            patient. Return to normal activities tomorrow.                            Written discharge instructions were provided to the                            patient.                           - Resume previous diet.                           - Continue  present medications. Increase omeprazole                            to 40 mg twice daily before 1st and last meal of                            the day for esophagitis on once daily dosing.                           - Await pathology results. Beverley Fiedler, MD 05/26/2023 12:03:30 PM This report has been signed electronically.

## 2023-05-26 NOTE — Progress Notes (Signed)
 GASTROENTEROLOGY PROCEDURE H&P NOTE   Primary Care Physician: Tresa Garter, MD    Reason for Procedure:  GERD, family history of esophageal cancer and personal history of colonic polyps  Plan:    EGD and colonoscopy  Patient is appropriate for endoscopic procedure(s) in the ambulatory ([facility]) setting.  The nature of the procedure, as well as the risks, benefits, and alternatives were carefully and thoroughly reviewed with the patient. Ample time for discussion and questions allowed. The patient understood, was satisfied, and agreed to proceed.     HPI: Bryan Ward is a 58 y.o. male who presents for EGD and colonoscopy.  Medical history as below.  Tolerated the prep.  No recent chest pain or shortness of breath.  No abdominal pain today.  Past Medical History:  Diagnosis Date   GERD (gastroesophageal reflux disease)    Helicobacter pylori antibody positive    Hx of adenomatous polyp of colon     Past Surgical History:  Procedure Laterality Date   COLONOSCOPY     KNEE ARTHROSCOPY W/ LASER Left 2002   Dr Thurston Hole   POLYPECTOMY      Prior to Admission medications   Medication Sig Start Date End Date Taking? Authorizing Provider  Cholecalciferol (VITAMIN D3) 2000 units capsule Take by mouth.   Yes [provider]  omeprazole (PRILOSEC) 40 MG capsule Take 1 capsule (40 mg total) by mouth daily. 04/10/23  Yes McMichael, Saddie Benders, PA-C    Current Outpatient Medications  Medication Sig Dispense Refill   Cholecalciferol (VITAMIN D3) 2000 units capsule Take by mouth.     omeprazole (PRILOSEC) 40 MG capsule Take 1 capsule (40 mg total) by mouth daily. 30 capsule 12   Current Facility-Administered Medications  Medication Dose Route Frequency Provider Last Rate Last Admin   0.9 %  sodium chloride infusion  500 mL Intravenous Once Kayra Crowell, Carie Caddy, MD        Allergies as of 05/26/2023   (Not on File)    Family History  Problem Relation Age of Onset    Arthritis Father    Esophageal cancer Father 57   Liver cancer Sister    Colon cancer Neg Hx    Colon polyps Neg Hx    Diabetes Neg Hx    Kidney disease Neg Hx    Heart disease Neg Hx    Rectal cancer Neg Hx     Social History   Socioeconomic History   Marital status: Married    Spouse name: Not on file   Number of children: 1   Years of education: Not on file   Highest education level: Not on file  Occupational History   Occupation: Location manager  Tobacco Use   Smoking status: Former    Current packs/day: 0.00    Types: Cigarettes    Quit date: 10/15/2013    Years since quitting: 9.6   Smokeless tobacco: Never  Substance and Sexual Activity   Alcohol use: Yes    Alcohol/week: 0.0 standard drinks of alcohol    Comment: Occassionally   Drug use: No   Sexual activity: Yes  Other Topics Concern   Not on file  Social History Narrative   Not on file   Social Drivers of Health   Financial Resource Strain: Not on file  Food Insecurity: Not on file  Transportation Needs: Not on file  Physical Activity: Not on file  Stress: Not on file  Social Connections: Unknown (06/29/2021)   [facility] from [facility]   Social Network    Social Network: Not on file  Intimate Partner Violence: Unknown (05/21/2021)   Received from [facility], [facility]   HITS    Physically Hurt: Not on file    Insult or Talk Down To: Not on file    Threaten Physical Harm: Not on file    Scream or Curse: Not on file    Physical Exam: Vital signs in last 24 hours: @BP  (!) 147/100 (BP Location: Right Arm, Cuff Size: Normal)   Pulse 73   Temp (!) 97.4 F (36.3 C) (Temporal)   Ht 6' (1.829 m)   Wt 260 lb (117.9 kg)   SpO2 94%   BMI 35.26 kg/m  GEN: NAD EYE: Sclerae anicteric ENT: MMM CV: Non-tachycardic Pulm: CTA b/l GI: Soft, NT/ND NEURO:  Alert & Oriented x 3   Erick Blinks, MD [facility]  05/26/2023 11:12 AM

## 2023-05-26 NOTE — Progress Notes (Signed)
 Called to room to assist during endoscopic procedure.  Patient ID and intended procedure confirmed with present staff. Received instructions for my participation in the procedure from the performing physician.

## 2023-05-26 NOTE — Op Note (Signed)
  [facility] Patient Name: Bryan Ward Procedure Date: 05/26/2023 11:17 AM MRN: [id number] Endoscopist: Beverley Fiedler , MD, [id number] Age: 58 Referring MD:  Date of Birth: 1965-03-07 Gender: Male Account #: [id number] Procedure:                Colonoscopy Indications:              High risk colon cancer surveillance: Personal                            history of sessile serrated colon polyp (10 mm or                            greater in size), Last colonoscopy: December 2019                            (no polyps), March 2016 (SSP > 1 cm) Medicines:                Monitored Anesthesia Care Procedure:                Pre-Anesthesia Assessment:                           - Prior to the procedure, a History and Physical                            was performed, and patient medications and                            allergies were reviewed. The patient's tolerance of                            previous anesthesia was also reviewed. The risks                            and benefits of the procedure and the sedation                            options and risks were discussed with the patient.                            All questions were answered, and informed consent                            was obtained. Prior Anticoagulants: The patient has                            taken no anticoagulant or antiplatelet agents. ASA                            Grade Assessment: II - A patient with mild systemic                            disease. After reviewing the risks and benefits,  the patient was deemed in satisfactory condition to                            undergo the procedure.                           After obtaining informed consent, the colonoscope                            was passed under direct vision. Throughout the                            procedure, the patient's blood pressure, pulse, and                            oxygen saturations were  monitored continuously. The                            CF [id number] #[id number] was introduced through the anus                            and advanced to the cecum, identified by                            appendiceal orifice and ileocecal valve. The                            colonoscopy was performed without difficulty. The                            patient tolerated the procedure well. The quality                            of the bowel preparation was good. The ileocecal                            valve, appendiceal orifice, and rectum were                            photographed. Scope In: 11:39:47 AM Scope Out: 11:57:32 AM Scope Withdrawal Time: 0 hours 12 minutes 35 seconds  Total Procedure Duration: 0 hours 17 minutes 45 seconds  Findings:                 The digital rectal exam was normal.                           A 5 mm polyp was found in the sigmoid colon. The                            polyp was sessile. The polyp was removed with a                            cold snare. Resection and retrieval were complete.  Internal hemorrhoids were found during                            retroflexion. The hemorrhoids were small. Complications:            No immediate complications. Estimated Blood Loss:     Estimated blood loss was minimal. Impression:               - One 5 mm polyp in the sigmoid colon, removed with                            a cold snare. Resected and retrieved.                           - Small internal hemorrhoids. Recommendation:           - Patient has a contact number available for                            emergencies. The signs and symptoms of potential                            delayed complications were discussed with the                            patient. Return to normal activities tomorrow.                            Written discharge instructions were provided to the                            patient.                           -  Resume previous diet.                           - Continue present medications.                           - Await pathology results.                           - Repeat colonoscopy in 5 years for surveillance. Beverley Fiedler, MD 05/26/2023 12:05:29 PM This report has been signed electronically.

## 2023-05-30 ENCOUNTER — Telehealth: Payer: Self-pay | Admitting: *Deleted

## 2023-05-30 NOTE — Telephone Encounter (Signed)
  Follow up Call-     05/26/2023   10:49 AM 05/26/2023   10:33 AM  Call back number  Post procedure Call Back phone  # [phone]   Permission to leave phone message  Yes     Patient questions:  Do you have a fever, pain , or abdominal swelling? No. Pain Score  0 *  Have you tolerated food without any problems? Yes.    Have you been able to return to your normal activities? Yes.    Do you have any questions about your discharge instructions: Diet   No. Medications  No. Follow up visit  No.  Do you have questions or concerns about your Care? No.  Actions: * If pain score is 4 or above: No action needed, pain <4.

## 2023-05-31 LAB — SURGICAL PATHOLOGY

## 2023-06-06 ENCOUNTER — Encounter: Payer: Self-pay | Admitting: Internal Medicine

## 2023-12-25 ENCOUNTER — Encounter: Admitting: Internal Medicine
